# Patient Record
Sex: Female | Born: 1985 | Race: Black or African American | Hispanic: No | Marital: Married | Smoking: Never smoker
Health system: Southern US, Community
[De-identification: ages and names within clinical notes are randomized; demographics above are authoritative.]

## PROBLEM LIST (undated history)

## (undated) DIAGNOSIS — B019 Varicella without complication: Secondary | ICD-10-CM

## (undated) DIAGNOSIS — D219 Benign neoplasm of connective and other soft tissue, unspecified: Secondary | ICD-10-CM

## (undated) DIAGNOSIS — Z1371 Encounter for nonprocreative screening for genetic disease carrier status: Secondary | ICD-10-CM

## (undated) DIAGNOSIS — R87619 Unspecified abnormal cytological findings in specimens from cervix uteri: Secondary | ICD-10-CM

## (undated) DIAGNOSIS — Z9189 Other specified personal risk factors, not elsewhere classified: Secondary | ICD-10-CM

## (undated) DIAGNOSIS — N920 Excessive and frequent menstruation with regular cycle: Secondary | ICD-10-CM

## (undated) DIAGNOSIS — D649 Anemia, unspecified: Secondary | ICD-10-CM

## (undated) DIAGNOSIS — Z803 Family history of malignant neoplasm of breast: Secondary | ICD-10-CM

## (undated) HISTORY — DX: Anemia, unspecified: D64.9

## (undated) HISTORY — DX: Unspecified abnormal cytological findings in specimens from cervix uteri: R87.619

## (undated) HISTORY — DX: Excessive and frequent menstruation with regular cycle: N92.0

## (undated) HISTORY — DX: Varicella without complication: B01.9

## (undated) HISTORY — DX: Family history of malignant neoplasm of breast: Z80.3

## (undated) HISTORY — PX: CERVICAL BIOPSY  W/ LOOP ELECTRODE EXCISION: SUR135

## (undated) HISTORY — DX: Encounter for nonprocreative screening for genetic disease carrier status: Z13.71

## (undated) HISTORY — DX: Other specified personal risk factors, not elsewhere classified: Z91.89

## (undated) HISTORY — DX: Benign neoplasm of connective and other soft tissue, unspecified: D21.9

---

## 2007-06-14 HISTORY — PX: TUBAL LIGATION: SHX77

## 2014-07-15 ENCOUNTER — Ambulatory Visit: Payer: Self-pay | Admitting: Internal Medicine

## 2014-07-16 ENCOUNTER — Encounter (INDEPENDENT_AMBULATORY_CARE_PROVIDER_SITE_OTHER): Payer: Self-pay

## 2014-07-16 ENCOUNTER — Encounter: Payer: Self-pay | Admitting: Internal Medicine

## 2014-07-16 ENCOUNTER — Ambulatory Visit (INDEPENDENT_AMBULATORY_CARE_PROVIDER_SITE_OTHER): Payer: BLUE CROSS/BLUE SHIELD | Admitting: Internal Medicine

## 2014-07-16 VITALS — BP 102/70 | HR 79 | Temp 98.2°F | Ht 64.25 in | Wt 154.0 lb

## 2014-07-16 DIAGNOSIS — N898 Other specified noninflammatory disorders of vagina: Secondary | ICD-10-CM

## 2014-07-16 DIAGNOSIS — Z0000 Encounter for general adult medical examination without abnormal findings: Secondary | ICD-10-CM

## 2014-07-16 LAB — COMPREHENSIVE METABOLIC PANEL
ALBUMIN: 4.2 g/dL (ref 3.5–5.2)
ALK PHOS: 56 U/L (ref 39–117)
ALT: 8 U/L (ref 0–35)
AST: 12 U/L (ref 0–37)
BILIRUBIN TOTAL: 0.3 mg/dL (ref 0.2–1.2)
BUN: 16 mg/dL (ref 6–23)
CHLORIDE: 106 meq/L (ref 96–112)
CO2: 30 meq/L (ref 19–32)
Calcium: 9.2 mg/dL (ref 8.4–10.5)
Creatinine, Ser: 0.85 mg/dL (ref 0.40–1.20)
GFR: 101.28 mL/min (ref >60.00)
Glucose, Bld: 101 mg/dL — ABNORMAL HIGH (ref 70–99)
Potassium: 4.2 mEq/L (ref 3.5–5.1)
Sodium: 138 mEq/L (ref 135–145)
Total Protein: 6.8 g/dL (ref 6.0–8.3)

## 2014-07-16 LAB — CBC
HCT: 38.5 % (ref 36.0–46.0)
HEMOGLOBIN: 12.6 g/dL (ref 12.0–15.0)
MCHC: 32.8 g/dL (ref 30.0–36.0)
MCV: 75.1 fl — AB (ref 78.0–100.0)
PLATELETS: 332 10*3/uL (ref 150.0–400.0)
RBC: 5.12 Mil/uL — AB (ref 3.87–5.11)
RDW: 13.7 % (ref 11.5–15.5)
WBC: 5.1 10*3/uL (ref 4.0–10.5)

## 2014-07-16 LAB — LIPID PANEL
Cholesterol: 149 mg/dL (ref 0–200)
HDL: 57.8 mg/dL (ref >39.00)
LDL Cholesterol: 76 mg/dL (ref 0–99)
NonHDL: 91.2
Total CHOL/HDL Ratio: 3
Triglycerides: 76 mg/dL (ref 0.0–149.0)
VLDL: 15.2 mg/dL (ref 0.0–40.0)

## 2014-07-16 NOTE — Progress Notes (Signed)
Pre visit review using our clinic review tool, if applicable. No additional management support is needed unless otherwise documented below in the visit note. 

## 2014-07-16 NOTE — Patient Instructions (Signed)

## 2014-07-16 NOTE — Progress Notes (Signed)
HPI  Pt presents to the clinic today to establish care. She has not had a PCP in many years. She recently moved from [place], [place].   Flu: never Tetanus: unsure, she thinks it is less than 10 years ago LMP: 07/06/14, G3P3 Pap Smear: 2014- normal Dentist: anually  Of note, her mom had breast cancer diagnosed when she was 21. She did have a mammogram in 2014. She is due for a repeat mammogram this year.  She does have some issues with vaginal discharge. She did not want to answer any additional questions about her discharge. She request referral to gyn.  Past Medical History  Diagnosis Date  . Chicken pox     No current outpatient prescriptions on file.   No current facility-administered medications for this visit.    No Known Allergies  Family History  Problem Relation Age of Onset  . Alcohol abuse Mother   . Cancer Mother     breast  . Alcohol abuse Father   . Alcohol abuse Maternal Grandmother   . Cancer Maternal Grandmother     lung    History   Social History  . Marital Status: Married    Spouse Name: N/A    Number of Children: N/A  . Years of Education: N/A   Occupational History  . Not on file.   Social History Main Topics  . Smoking status: Never Smoker   . Smokeless tobacco: Never Used  . Alcohol Use: 0.0 oz/week    0 Not specified per week     Comment: occasional  . Drug Use: No  . Sexual Activity: Not on file   Other Topics Concern  . Not on file   Social History Narrative  . No narrative on file    ROS:  Constitutional: Denies fever, malaise, fatigue, headache or abrupt weight changes.  HEENT: Denies eye pain, eye redness, ear pain, ringing in the ears, wax buildup, runny nose, nasal congestion, bloody nose, or sore throat. Respiratory: Denies difficulty breathing, shortness of breath, cough or sputum production.   Cardiovascular: Denies chest pain, chest tightness, palpitations or swelling in the hands or feet.  Gastrointestinal:  Denies abdominal pain, bloating, constipation, diarrhea or blood in the stool.  GU: Pt reports vaginal discharge. Denies frequency, urgency, pain with urination, blood in urine, odor. Musculoskeletal: Denies decrease in range of motion, difficulty with gait, muscle pain or joint pain and swelling.  Skin: Denies redness, rashes, lesions or ulcercations.  Neurological: Denies dizziness, difficulty with memory, difficulty with speech or problems with balance and coordination.  Psych: Pt denies anxiety, depression, SI/HI.  No other specific complaints in a complete review of systems (except as listed in HPI above).  PE:  BP 102/70 mmHg  Pulse 79  Temp(Src) 98.2 F (36.8 C) (Oral)  Ht 5' 4.25" (1.632 m)  Wt 154 lb (69.854 kg)  BMI 26.23 kg/m2  SpO2 99%  LMP 07/06/2014 Wt Readings from Last 3 Encounters:  07/16/14 154 lb (69.854 kg)    General: Appears her stated age, well developed, well nourished in NAD. Skin: Warm, dry and intact. No rashes, lesion or ulceration noted. HEENT: Head: normal shape and size; Eyes: sclera white, no icterus, conjunctiva pink, PERRLA and EOMs intact; Ears: Tm's gray and intact, normal light reflex; Nose: mucosa pink and moist, septum midline; Throat/Mouth: Teeth present, mucosa pink and moist, no lesions or ulcerations noted.  Neck:  Neck supple, trachea midline. No masses, lumps or thyromegaly present.  Cardiovascular: Normal rate and  rhythm. S1,S2 noted.  No murmur, rubs or gallops noted. No JVD or BLE edema.  Pulmonary/Chest: Normal effort and positive vesicular breath sounds. No respiratory distress. No wheezes, rales or ronchi noted.  Abdomen: Soft and nontender. Normal bowel sounds, no bruits noted. No distention or masses noted. Liver, spleen and kidneys non palpable. Pelvic: She declines exam today. Musculoskeletal: Normal range of motion. Strength 5/5 BUE/BLE. No difficulty with gait.  Neurological: Alert and oriented. Cranial nerves II-XII grossly  intact. Coordination normal.  Psychiatric: Mood and affect normal. Behavior is normal. Judgment and thought content normal.    Assessment and Plan:  Preventative Health Maintenance:  She declines flu and Tdap today Encouraged her to work on diet and exercise Will refer to [facility] for vaginal discharge and to schedule her mammogram Will check CBC, CMET and Lipid profile today  RTC in 1 year or sooner if needed

## 2016-05-03 LAB — HM PAP SMEAR: HM Pap smear: NORMAL

## 2016-10-03 ENCOUNTER — Encounter: Payer: Self-pay | Admitting: Internal Medicine

## 2016-10-03 ENCOUNTER — Ambulatory Visit (INDEPENDENT_AMBULATORY_CARE_PROVIDER_SITE_OTHER): Payer: BLUE CROSS/BLUE SHIELD | Admitting: Internal Medicine

## 2016-10-03 VITALS — BP 112/70 | HR 70 | Temp 98.0°F | Wt 176.0 lb

## 2016-10-03 DIAGNOSIS — B029 Zoster without complications: Secondary | ICD-10-CM | POA: Diagnosis not present

## 2016-10-03 MED ORDER — VALACYCLOVIR HCL 1 G PO TABS: 1000.0000 mg | ORAL_TABLET | Freq: Three times a day (TID) | ORAL | 0 refills | Status: DC

## 2016-10-03 NOTE — Patient Instructions (Signed)
Shingles Shingles is an infection that causes a painful skin rash and fluid-filled blisters. Shingles is caused by the same virus that causes chickenpox. Shingles only develops in people who:  Have had chickenpox.  Have gotten the chickenpox vaccine. (This is rare.) The first symptoms of shingles may be itching, tingling, or pain in an area on your skin. A rash will follow in a few days or weeks. The rash is usually on one side of the body in a bandlike or beltlike pattern. Over time, the rash turns into fluid-filled blisters that break open, scab over, and dry up. Medicines may:  Help you manage pain.  Help you recover more quickly.  Help to prevent long-term problems. Follow these instructions at home: Medicines  Take medicines only as told by your doctor.  Apply an anti-itch or numbing cream to the affected area as told by your doctor. Blister and Rash Care  Take a cool bath or put cool compresses on the area of the rash or blisters as told by your doctor. This may help with pain and itching.  Keep your rash covered with a loose bandage (dressing). Wear loose-fitting clothing.  Keep your rash and blisters clean with mild soap and cool water or as told by your doctor.  Check your rash every day for signs of infection. These include redness, swelling, and pain that lasts or gets worse.  Do not pick your blisters.  Do not scratch your rash. General instructions  Rest as told by your doctor.  Keep all follow-up visits as told by your doctor. This is important.  Until your blisters scab over, your infection can cause chickenpox in people who have never had it or been vaccinated against it. To prevent this from happening, avoid touching other people or being around other people, especially:  Babies.  Pregnant women.  Children who have eczema.  Elderly people who have transplants.  People who have chronic illnesses, such as leukemia or AIDS. Contact a doctor if:  Your  pain does not get better with medicine.  Your pain does not get better after the rash heals.  Your rash looks infected. Signs of infection include:  Redness.  Swelling.  Pain that lasts or gets worse. Get help right away if:  The rash is on your face or nose.  You have pain in your face, pain around your eye area, or loss of feeling on one side of your face.  You have ear pain or you have ringing in your ear.  You have loss of taste.  Your condition gets worse. This information is not intended to replace advice given to you by your health care provider. Make sure you discuss any questions you have with your health care provider. Document Released: 11/16/2007 Document Revised: 01/24/2016 Document Reviewed: 03/11/2014 Elsevier Interactive Patient Education  2017 Elsevier Inc.  

## 2016-10-03 NOTE — Progress Notes (Signed)
Subjective:    Patient ID: Gina Benson, female    DOB: Nov 14, 1985, 31 y.o.   MRN: [id number]  HPI  Pt presents to the clinic today with c/o a rash on the right side of her abdomen. She noticed this 3 days ago. The rash wraps around to her mid back. She reports the rash is painful. She describes the pain as sharp and shooting. The rash has not spread to any other part of her body. She denies changes in soaps, lotions or detergents. She has tried Calamine lotion without any relief.   Review of Systems      Past Medical History:  Diagnosis Date  . Chicken pox     No current outpatient prescriptions on file.   No current facility-administered medications for this visit.     No Known Allergies  Family History  Problem Relation Age of Onset  . Alcohol abuse Mother   . Cancer Mother     breast  . Alcohol abuse Father   . Alcohol abuse Maternal Grandmother   . Cancer Maternal Grandmother     lung    Social History   Social History  . Marital status: Married    Spouse name: N/A  . Number of children: N/A  . Years of education: N/A   Occupational History  . Not on file.   Social History Main Topics  . Smoking status: Never Smoker  . Smokeless tobacco: Never Used  . Alcohol use 0.0 oz/week     Comment: occasional  . Drug use: No  . Sexual activity: Yes   Other Topics Concern  . Not on file   Social History Narrative  . No narrative on file     Constitutional: Denies fever, malaise, fatigue, headache or abrupt weight changes.  Skin: Pt reports rash. Denies ulcercations.    No other specific complaints in a complete review of systems (except as listed in HPI above).  Objective:   Physical Exam   BP 112/70   Pulse 70   Temp 98 F (36.7 C) (Oral)   Wt 176 lb (79.8 kg)   LMP 09/14/2016   SpO2 98%   BMI 29.98 kg/m  Wt Readings from Last 3 Encounters:  10/03/16 176 lb (79.8 kg)  07/16/14 154 lb (69.9 kg)    General: Appears her  stated age, in  NAD. Skin: Scattered vesicular lesions noted in a dermatomal pattern. Most lesion on right anterior abdomen. She is sensitive to touch from midline back around to right abdomen.  BMET    Component Value Date/Time   NA 138 07/16/2014 1512   K 4.2 07/16/2014 1512   CL 106 07/16/2014 1512   CO2 30 07/16/2014 1512   GLUCOSE 101 (H) 07/16/2014 1512   BUN 16 07/16/2014 1512   CREATININE 0.85 07/16/2014 1512   CALCIUM 9.2 07/16/2014 1512    Lipid Panel     Component Value Date/Time   CHOL 149 07/16/2014 1512   TRIG 76.0 07/16/2014 1512   HDL 57.80 07/16/2014 1512   CHOLHDL 3 07/16/2014 1512   VLDL 15.2 07/16/2014 1512   LDLCALC 76 07/16/2014 1512    CBC    Component Value Date/Time   WBC 5.1 07/16/2014 1512   RBC 5.12 (H) 07/16/2014 1512   HGB 12.6 07/16/2014 1512   HCT 38.5 07/16/2014 1512   PLT 332.0 07/16/2014 1512   MCV 75.1 (L) 07/16/2014 1512   MCHC 32.8 07/16/2014 1512   RDW 13.7 07/16/2014 1512  Hgb A1C No results found for: HGBA1C       Assessment & Plan:   Shingles:  eRx for Valtrex 1 gm TID x 7 days  RTC as needed or if symptoms persist or worsen Rashada Klontz, NP

## 2016-10-04 ENCOUNTER — Telehealth: Payer: Self-pay

## 2016-10-04 MED ORDER — GABAPENTIN 100 MG PO CAPS: 100.0000 mg | ORAL_CAPSULE | Freq: Three times a day (TID) | ORAL | 0 refills | Status: DC | PRN

## 2016-10-04 NOTE — Telephone Encounter (Signed)
Pt left /vm; pt seen 10/03/16 with shingles; pt presently taking ibuprofen 800mg  for pain without relief. Pt request med for pain; I tried x 2 to contact pt for more info about where pain is and degree of pain but unable to reach pt.Please advise. [facility].

## 2016-10-04 NOTE — Telephone Encounter (Signed)
Neurontin sent to pharmacy

## 2016-10-04 NOTE — Addendum Note (Signed)
Addended by: Jearld Fenton on: 10/04/2016 10:12 AM   Modules accepted: Orders

## 2016-10-11 ENCOUNTER — Telehealth: Payer: Self-pay | Admitting: Internal Medicine

## 2016-10-11 NOTE — Telephone Encounter (Signed)
Left message asking pt to call office I have questions about fmla paperwork

## 2016-10-13 NOTE — Telephone Encounter (Signed)
Spoke with pt she stated she needed intermittent leave FMLA paperwork in [place]

## 2016-10-14 NOTE — Telephone Encounter (Signed)
I spoke to pt and she reports she is doing much better, still having discomfort and intermittent shooting pain in the area also tingling and itching... Rash has gone away for most part... Pt states she needs the paperwork filled for intermittent and may reoccur as far as the Sx... Please advise

## 2016-10-14 NOTE — Telephone Encounter (Signed)
Before I fill this out, can you call and see how her shingles is doing. How is her pain currently. How many days did she miss from work?

## 2016-10-17 DIAGNOSIS — Z7689 Persons encountering health services in other specified circumstances: Secondary | ICD-10-CM

## 2016-10-17 NOTE — Telephone Encounter (Signed)
Copy for file Copy for scan Copy for pt Copy for billing

## 2016-10-17 NOTE — Telephone Encounter (Signed)
Left message asking pt to call office  °

## 2016-10-17 NOTE — Telephone Encounter (Signed)
Done, given to Robin 

## 2016-10-17 NOTE — Telephone Encounter (Signed)
Paperwork faxed °

## 2017-07-14 DIAGNOSIS — Z9189 Other specified personal risk factors, not elsewhere classified: Secondary | ICD-10-CM

## 2017-07-14 DIAGNOSIS — Z1371 Encounter for nonprocreative screening for genetic disease carrier status: Secondary | ICD-10-CM

## 2017-07-14 HISTORY — DX: Encounter for nonprocreative screening for genetic disease carrier status: Z13.71

## 2017-07-14 HISTORY — DX: Other specified personal risk factors, not elsewhere classified: Z91.89

## 2017-07-25 ENCOUNTER — Ambulatory Visit (INDEPENDENT_AMBULATORY_CARE_PROVIDER_SITE_OTHER): Payer: BLUE CROSS/BLUE SHIELD | Admitting: Advanced Practice Midwife

## 2017-07-25 ENCOUNTER — Encounter: Payer: Self-pay | Admitting: Advanced Practice Midwife

## 2017-07-25 VITALS — BP 122/70 | Ht 64.0 in | Wt 180.0 lb

## 2017-07-25 DIAGNOSIS — Z01419 Encounter for gynecological examination (general) (routine) without abnormal findings: Secondary | ICD-10-CM | POA: Diagnosis not present

## 2017-07-25 NOTE — Progress Notes (Signed)
Patient ID: Gina Benson, female   DOB: 04-Aug-1985, 32 y.o.   MRN: [id number]    Gynecology Annual Exam  PCP: Jearld Fenton, NP  Chief Complaint:  Chief Complaint  Patient presents with  . Annual Exam    History of Present Illness: Patient is a 32 y.o. [id number] presents for annual exam. The patient has complaint today of chronic BV and yeast infections. She has noticed an odor recently and wonders if she has BV. She denies itching, irritation or discharge. She states her periods are long, heavy and painful but declines hormonal regulation. She has spotting for 3 days followed by 7 days of heavy bleeding and another 3 days of spotting.   LMP: Patient's last menstrual period was 07/04/2017. Average Interval: regular, 28 days Duration of flow: 13 days Heavy Menses: yes for 7 of the 13 days Clots: no Intermenstrual Bleeding: no Postcoital Bleeding: no Dysmenorrhea: yes  The patient is sexually active. She currently uses tubal ligation for contraception. She denies dyspareunia.  The patient does perform self breast exams.  There is notable family history of breast or ovarian cancer in her family. Her mother was diagnosed with breast cancer at age 20 and again recently now at age 28. The patient accepts [employer] screening today.  The patient wears seatbelts: yes.   The patient has regular exercise: yes.  She has been eating a healthy diet and has adequate intake of h2o.   The patient denies current symptoms of depression.    Review of Systems: Review of Systems  Constitutional: Negative.   HENT: Negative.   Eyes: Negative.   Respiratory: Negative.   Cardiovascular: Negative.   Gastrointestinal: Negative.   Genitourinary: Negative.   Musculoskeletal: Negative.   Skin: Negative.   Neurological: Negative.   Endo/Heme/Allergies: Negative.   Psychiatric/Behavioral: Negative.     Past Medical History:  Past Medical History:  Diagnosis Date  . Abnormal Pap smear of cervix   .  Anemia   . Chicken pox   . Fibroids   . Menorrhagia     Past Surgical History:  Past Surgical History:  Procedure Laterality Date  . CERVICAL BIOPSY  W/ LOOP ELECTRODE EXCISION    . TUBAL LIGATION  2009    Gynecologic History:  Patient's last menstrual period was 07/04/2017. Contraception: tubal ligation Last Pap: 2017 Results were:  no abnormalities  Last mammogram: she has had a mammogram in the past for benign cysts  Obstetric History: [id number]  Family History:  Family History  Problem Relation Age of Onset  . Alcohol abuse Mother   . Cancer Mother        breast  . Alcohol abuse Father   . Alcohol abuse Maternal Grandmother   . Cancer Maternal Grandmother        lung    Social History:  Social History   Socioeconomic History  . Marital status: Married    Spouse name: Not on file  . Number of children: Not on file  . Years of education: Not on file  . Highest education level: Not on file  Social Needs  . Financial resource strain: Not on file  . Food insecurity - worry: Not on file  . Food insecurity - inability: Not on file  . Transportation needs - medical: Not on file  . Transportation needs - non-medical: Not on file  Occupational History  . Not on file  Tobacco Use  . Smoking status: Never Smoker  . Smokeless tobacco: Never Used  Substance  and Sexual Activity  . Alcohol use: Yes    Alcohol/week: 0.0 oz    Comment: occasional  . Drug use: No  . Sexual activity: Yes    Birth control/protection: Surgical  Other Topics Concern  . Not on file  Social History Narrative  . Not on file    Allergies:  No Known Allergies  Medications: Prior to Admission medications   Medication Sig Start Date End Date Taking? Authorizing Provider  gabapentin (NEURONTIN) 100 MG capsule Take 1 capsule (100 mg total) by mouth 3 (three) times daily as needed. Patient not taking: Reported on 07/25/2017 10/04/16   Jearld Fenton, NP  valACYclovir (VALTREX) 1000 MG tablet  Take 1 tablet (1,000 mg total) by mouth 3 (three) times daily. Patient not taking: Reported on 07/25/2017 10/03/16   Jearld Fenton, NP    Physical Exam Vitals: Blood pressure 122/70, height 5\' 4"  (1.626 m), weight 180 lb (81.6 kg), last menstrual period 07/04/2017.  General: NAD HEENT: normocephalic, anicteric Thyroid: no enlargement, no palpable nodules Pulmonary: No increased work of breathing, CTAB Cardiovascular: RRR, distal pulses 2+ Breast: Breast symmetrical, no tenderness, no palpable nodules or masses, no skin or nipple retraction present, no nipple discharge.  No axillary or supraclavicular lymphadenopathy. Abdomen: NABS, soft, non-tender, non-distended.  Umbilicus without lesions.  No hepatomegaly, splenomegaly or masses palpable. No evidence of hernia  Genitourinary:  External: Normal external female genitalia.  Normal urethral meatus, normal  Bartholin's and Skene's glands.    Vagina: Normal vaginal mucosa, no evidence of prolapse.    Cervix: Grossly normal in appearance, no bleeding, no CMT  Uterus: Non-enlarged, mobile, normal contour.    Adnexa: ovaries non-enlarged, no adnexal masses  Rectal: deferred  Lymphatic: no evidence of inguinal lymphadenopathy Extremities: no edema, erythema, or tenderness Neurologic: Grossly intact Psychiatric: mood appropriate, affect full  Wet prep negative for clue cells, whiff, yeast   Assessment: 32 y.o. G3P3003 routine annual exam  Plan: Problem List Items Addressed This Visit    None    Visit Diagnoses    Well woman exam with routine gynecological exam    -  Primary      1) Mammogram - recommend yearly screening mammogram.  Mammogram begin regular screening at age 32 or sooner pending MyRisk results   2) STI screening was offered and declined  3) ASCCP guidelines and rational discussed.  Patient opts for every 3 years screening interval  4) Contraception - the patient is currently using  tubal ligation.    5)  Colonoscopy -- Screening recommended starting at age 33 for average risk individuals, age 13 for individuals deemed at increased risk (including African Americans) and recommended to continue until age 33.  For patient age 33-85 individualized approach is recommended.  Gold standard screening is via colonoscopy, Cologuard screening is an acceptable alternative for patient unwilling or unable to undergo colonoscopy.  "Colorectal cancer screening for average?risk adults: 2018 guideline update from the American Cancer Society"CA: A Cancer Journal for Clinicians: Nov 09, 2016   6) Routine healthcare maintenance including cholesterol, diabetes screening discussed Declines  7) Return in 1 year (on 07/25/2018) for annual established gyn.  Rod Can, [facility], [facility] 07/25/2017, 4:40 PM

## 2017-07-25 NOTE — Patient Instructions (Signed)
Health Maintenance, Female Adopting a healthy lifestyle and getting preventive care can go a long way to promote health and wellness. Talk with your health care provider about what schedule of regular examinations is right for you. This is a good chance for you to check in with your provider about disease prevention and staying healthy. In between checkups, there are plenty of things you can do on your own. Experts have done a lot of research about which lifestyle changes and preventive measures are most likely to keep you healthy. Ask your health care provider for more information. Weight and diet Eat a healthy diet  Be sure to include plenty of vegetables, fruits, low-fat dairy products, and lean protein.  Do not eat a lot of foods high in solid fats, added sugars, or salt.  Get regular exercise. This is one of the most important things you can do for your health. ? Most adults should exercise for at least 150 minutes each week. The exercise should increase your heart rate and make you sweat (moderate-intensity exercise). ? Most adults should also do strengthening exercises at least twice a week. This is in addition to the moderate-intensity exercise.  Maintain a healthy weight  Body mass index (BMI) is a measurement that can be used to identify possible weight problems. It estimates body fat based on height and weight. Your health care provider can help determine your BMI and help you achieve or maintain a healthy weight.  For females 69 years of age and older: ? A BMI below 18.5 is considered underweight. ? A BMI of 18.5 to 24.9 is normal. ? A BMI of 25 to 29.9 is considered overweight. ? A BMI of 30 and above is considered obese.  Watch levels of cholesterol and blood lipids  You should start having your blood tested for lipids and cholesterol at 33 years of age, then have this test every 5 years.  You may need to have your cholesterol levels checked more often if: ? Your lipid or  cholesterol levels are high. ? You are older than 33 years of age. ? You are at high risk for heart disease.  Cancer screening Lung Cancer  Lung cancer screening is recommended for adults 70-27 years old who are at high risk for lung cancer because of a history of smoking.  A yearly low-dose CT scan of the lungs is recommended for people who: ? Currently smoke. ? Have quit within the past 15 years. ? Have at least a 30-pack-year history of smoking. A pack year is smoking an average of one pack of cigarettes a day for 1 year.  Yearly screening should continue until it has been 15 years since you quit.  Yearly screening should stop if you develop a health problem that would prevent you from having lung cancer treatment.  Breast Cancer  Practice breast self-awareness. This means understanding how your breasts normally appear and feel.  It also means doing regular breast self-exams. Let your health care provider know about any changes, no matter how small.  If you are in your 20s or 30s, you should have a clinical breast exam (CBE) by a health care provider every 1-3 years as part of a regular health exam.  If you are 68 or older, have a CBE every year. Also consider having a breast X-ray (mammogram) every year.  If you have a family history of breast cancer, talk to your health care provider about genetic screening.  If you are at high risk  for breast cancer, talk to your health care provider about having an MRI and a mammogram every year.  Breast cancer gene (BRCA) assessment is recommended for women who have family members with BRCA-related cancers. BRCA-related cancers include: ? Breast. ? Ovarian. ? Tubal. ? Peritoneal cancers.  Results of the assessment will determine the need for genetic counseling and BRCA1 and BRCA2 testing.  Cervical Cancer Your health care provider may recommend that you be screened regularly for cancer of the pelvic organs (ovaries, uterus, and  vagina). This screening involves a pelvic examination, including checking for microscopic changes to the surface of your cervix (Pap test). You may be encouraged to have this screening done every 3 years, beginning at age 22.  For women ages 56-65, health care providers may recommend pelvic exams and Pap testing every 3 years, or they may recommend the Pap and pelvic exam, combined with testing for human papilloma virus (HPV), every 5 years. Some types of HPV increase your risk of cervical cancer. Testing for HPV may also be done on women of any age with unclear Pap test results.  Other health care providers may not recommend any screening for nonpregnant women who are considered low risk for pelvic cancer and who do not have symptoms. Ask your health care provider if a screening pelvic exam is right for you.  If you have had past treatment for cervical cancer or a condition that could lead to cancer, you need Pap tests and screening for cancer for at least 20 years after your treatment. If Pap tests have been discontinued, your risk factors (such as having a new sexual partner) need to be reassessed to determine if screening should resume. Some women have medical problems that increase the chance of getting cervical cancer. In these cases, your health care provider may recommend more frequent screening and Pap tests.  Colorectal Cancer  This type of cancer can be detected and often prevented.  Routine colorectal cancer screening usually begins at 33 years of age and continues through 33 years of age.  Your health care provider may recommend screening at an earlier age if you have risk factors for colon cancer.  Your health care provider may also recommend using home test kits to check for hidden blood in the stool.  A small camera at the end of a tube can be used to examine your colon directly (sigmoidoscopy or colonoscopy). This is done to check for the earliest forms of colorectal  cancer.  Routine screening usually begins at age 33.  Direct examination of the colon should be repeated every 5-10 years through 33 years of age. However, you may need to be screened more often if early forms of precancerous polyps or small growths are found.  Skin Cancer  Check your skin from head to toe regularly.  Tell your health care provider about any new moles or changes in moles, especially if there is a change in a mole's shape or color.  Also tell your health care provider if you have a mole that is larger than the size of a pencil eraser.  Always use sunscreen. Apply sunscreen liberally and repeatedly throughout the day.  Protect yourself by wearing long sleeves, pants, a wide-brimmed hat, and sunglasses whenever you are outside.  Heart disease, diabetes, and high blood pressure  High blood pressure causes heart disease and increases the risk of stroke. High blood pressure is more likely to develop in: ? People who have blood pressure in the high end of  the normal range (130-139/85-89 mm Hg). ? People who are overweight or obese. ? People who are African American.  If you are 21-29 years of age, have your blood pressure checked every 3-5 years. If you are 3 years of age or older, have your blood pressure checked every year. You should have your blood pressure measured twice-once when you are at a hospital or clinic, and once when you are not at a hospital or clinic. Record the average of the two measurements. To check your blood pressure when you are not at a hospital or clinic, you can use: ? An automated blood pressure machine at a pharmacy. ? A home blood pressure monitor.  If you are between 17 years and 37 years old, ask your health care provider if you should take aspirin to prevent strokes.  Have regular diabetes screenings. This involves taking a blood sample to check your fasting blood sugar level. ? If you are at a normal weight and have a low risk for diabetes,  have this test once every three years after 33 years of age. ? If you are overweight and have a high risk for diabetes, consider being tested at a younger age or more often. Preventing infection Hepatitis B  If you have a higher risk for hepatitis B, you should be screened for this virus. You are considered at high risk for hepatitis B if: ? You were born in a country where hepatitis B is common. Ask your health care provider which countries are considered high risk. ? Your parents were born in a high-risk country, and you have not been immunized against hepatitis B (hepatitis B vaccine). ? You have HIV or AIDS. ? You use needles to inject street drugs. ? You live with someone who has hepatitis B. ? You have had sex with someone who has hepatitis B. ? You get hemodialysis treatment. ? You take certain medicines for conditions, including cancer, organ transplantation, and autoimmune conditions.  Hepatitis C  Blood testing is recommended for: ? Everyone born from [birth year] through 1966. ? Anyone with known risk factors for hepatitis C.  Sexually transmitted infections (STIs)  You should be screened for sexually transmitted infections (STIs) including gonorrhea and chlamydia if: ? You are sexually active and are younger than 33 years of age. ? You are older than 33 years of age and your health care provider tells you that you are at risk for this type of infection. ? Your sexual activity has changed since you were last screened and you are at an increased risk for chlamydia or gonorrhea. Ask your health care provider if you are at risk.  If you do not have HIV, but are at risk, it may be recommended that you take a prescription medicine daily to prevent HIV infection. This is called pre-exposure prophylaxis (PrEP). You are considered at risk if: ? You are sexually active and do not regularly use condoms or know the HIV status of your partner(s). ? You take drugs by injection. ? You are  sexually active with a partner who has HIV.  Talk with your health care provider about whether you are at high risk of being infected with HIV. If you choose to begin PrEP, you should first be tested for HIV. You should then be tested every 3 months for as long as you are taking PrEP. Pregnancy  If you are premenopausal and you may become pregnant, ask your health care provider about preconception counseling.  If you may become  pregnant, take 400 to 800 micrograms (mcg) of folic acid every day.  If you want to prevent pregnancy, talk to your health care provider about birth control (contraception). Osteoporosis and menopause  Osteoporosis is a disease in which the bones lose minerals and strength with aging. This can result in serious bone fractures. Your risk for osteoporosis can be identified using a bone density scan.  If you are 52 years of age or older, or if you are at risk for osteoporosis and fractures, ask your health care provider if you should be screened.  Ask your health care provider whether you should take a calcium or vitamin D supplement to lower your risk for osteoporosis.  Menopause may have certain physical symptoms and risks.  Hormone replacement therapy may reduce some of these symptoms and risks. Talk to your health care provider about whether hormone replacement therapy is right for you. Follow these instructions at home:  Schedule regular health, dental, and eye exams.  Stay current with your immunizations.  Do not use any tobacco products including cigarettes, chewing tobacco, or electronic cigarettes.  If you are pregnant, do not drink alcohol.  If you are breastfeeding, limit how much and how often you drink alcohol.  Limit alcohol intake to no more than 1 drink per day for nonpregnant women. One drink equals 12 ounces of beer, 5 ounces of wine, or 1 ounces of hard liquor.  Do not use street drugs.  Do not share needles.  Ask your health care  provider for help if you need support or information about quitting drugs.  Tell your health care provider if you often feel depressed.  Tell your health care provider if you have ever been abused or do not feel safe at home. This information is not intended to replace advice given to you by your health care provider. Make sure you discuss any questions you have with your health care provider. Document Released: 12/13/2010 Document Revised: 11/05/2015 Document Reviewed: 03/03/2015 Elsevier Interactive Patient Education  2018 [employer].     Why follow it? Research shows. . Those who follow the Mediterranean diet have a reduced risk of heart disease  . The diet is associated with a reduced incidence of Parkinson's and Alzheimer's diseases . People following the diet may have longer life expectancies and lower rates of chronic diseases  . The Dietary Guidelines for Americans recommends the Mediterranean diet as an eating plan to promote health and prevent disease  What Is the Mediterranean Diet?  . Healthy eating plan based on typical foods and recipes of Mediterranean-style cooking . The diet is primarily a plant based diet; these foods should make up a majority of meals   Starches - Plant based foods should make up a majority of meals - They are an important sources of vitamins, minerals, energy, antioxidants, and fiber - Choose whole grains, foods high in fiber and minimally processed items  - Typical grain sources include wheat, oats, barley, corn, brown rice, bulgar, farro, millet, polenta, couscous  - Various types of beans include chickpeas, lentils, fava beans, black beans, white beans   Fruits  Veggies - Large quantities of antioxidant rich fruits & veggies; 6 or more servings  - Vegetables can be eaten raw or lightly drizzled with oil and cooked  - Vegetables common to the traditional Mediterranean Diet include: artichokes, arugula, beets, broccoli, brussel sprouts, cabbage,  carrots, celery, collard greens, cucumbers, eggplant, kale, leeks, lemons, lettuce, mushrooms, okra, onions, peas, peppers, potatoes, pumpkin, radishes, rutabaga,  shallots, spinach, sweet potatoes, turnips, zucchini - Fruits common to the Mediterranean Diet include: apples, apricots, avocados, cherries, clementines, dates, figs, grapefruits, grapes, melons, nectarines, oranges, peaches, pears, pomegranates, strawberries, tangerines  Fats - Replace butter and margarine with healthy oils, such as olive oil, canola oil, and tahini  - Limit nuts to no more than a handful a day  - Nuts include walnuts, almonds, pecans, pistachios, pine nuts  - Limit or avoid candied, honey roasted or heavily salted nuts - Olives are central to the [employer] - can be eaten whole or used in a variety of dishes   Meats Protein - Limiting red meat: no more than a few times a month - When eating red meat: choose lean cuts and keep the portion to the size of deck of cards - Eggs: approx. 0 to 4 times a week  - Fish and lean poultry: at least 2 a week  - Healthy protein sources include, chicken, [place], lean beef, lamb - Increase intake of seafood such as tuna, salmon, trout, mackerel, shrimp, scallops - Avoid or limit high fat processed meats such as sausage and bacon  Dairy - Include moderate amounts of low fat dairy products  - Focus on healthy dairy such as fat free yogurt, skim milk, low or reduced fat cheese - Limit dairy products higher in fat such as whole or 2% milk, cheese, ice cream  Alcohol - Moderate amounts of red wine is ok  - No more than 5 oz daily for women (all ages) and men older than age 18  - No more than 10 oz of wine daily for men younger than 98  Other - Limit sweets and other desserts  - Use herbs and spices instead of salt to flavor foods  - Herbs and spices common to the traditional Mediterranean Diet include: basil, bay leaves, chives, cloves, cumin, fennel, garlic, lavender, marjoram,  mint, oregano, parsley, pepper, rosemary, sage, savory, sumac, tarragon, thyme   It's not just a diet, it's a lifestyle:  . The Mediterranean diet includes lifestyle factors typical of those in the region  . Foods, drinks and meals are best eaten with others and savored . Daily physical activity is important for overall good health . This could be strenuous exercise like running and aerobics . This could also be more leisurely activities such as walking, housework, yard-work, or taking the stairs . Moderation is the key; a balanced and healthy diet accommodates most foods and drinks . Consider portion sizes and frequency of consumption of certain foods   Meal Ideas & Options:  . Breakfast:  o Whole wheat toast or whole wheat English muffins with peanut butter & hard boiled egg o Steel cut oats topped with apples & cinnamon and skim milk  o Fresh fruit: banana, strawberries, melon, berries, peaches  o Smoothies: strawberries, bananas, greek yogurt, peanut butter o Low fat greek yogurt with blueberries and granola  o Egg white omelet with spinach and mushrooms o Breakfast couscous: whole wheat couscous, apricots, skim milk, cranberries  . Sandwiches:  o Hummus and grilled vegetables (peppers, zucchini, squash) on whole wheat bread   o Grilled chicken on whole wheat pita with lettuce, tomatoes, cucumbers or tzatziki  o Tuna salad on whole wheat bread: tuna salad made with greek yogurt, olives, red peppers, capers, green onions o Garlic rosemary lamb pita: lamb sauted with garlic, rosemary, salt & pepper; add lettuce, cucumber, greek yogurt to pita - flavor with lemon juice and black pepper  .  Seafood:  o Mediterranean grilled salmon, seasoned with garlic, basil, parsley, lemon juice and black pepper o Shrimp, lemon, and spinach whole-grain pasta salad made with low fat greek yogurt  o Seared scallops with lemon orzo  o Seared tuna steaks seasoned salt, pepper, coriander topped with tomato  mixture of olives, tomatoes, olive oil, minced garlic, parsley, green onions and cappers  . Meats:  o Herbed greek chicken salad with kalamata olives, cucumber, feta  o Red bell peppers stuffed with spinach, bulgur, lean ground beef (or lentils) & topped with feta   o Kebabs: skewers of chicken, tomatoes, onions, zucchini, squash  o Turkey burgers: made with red onions, mint, dill, lemon juice, feta cheese topped with roasted red peppers . Vegetarian o Cucumber salad: cucumbers, artichoke hearts, celery, red onion, feta cheese, tossed in olive oil & lemon juice  o Hummus and whole grain pita points with a greek salad (lettuce, tomato, feta, olives, cucumbers, red onion) o Lentil soup with celery, carrots made with vegetable broth, garlic, salt and pepper  o Tabouli salad: parsley, bulgur, mint, scallions, cucumbers, tomato, radishes, lemon juice, olive oil, salt and pepper.      American Heart Association (AHA) Exercise Recommendation  Being physically active is important to prevent heart disease and stroke, the nation's No. 1and No. 5killers. To improve overall cardiovascular health, we suggest at least 150 minutes per week of moderate exercise or 75 minutes per week of vigorous exercise (or a combination of moderate and vigorous activity). Thirty minutes a day, five times a week is an easy goal to remember. You will also experience benefits even if you divide your time into two or three segments of 10 to 15 minutes per day.  For people who would benefit from lowering their blood pressure or cholesterol, we recommend 40 minutes of aerobic exercise of moderate to vigorous intensity three to four times a week to lower the risk for heart attack and stroke.  Physical activity is anything that makes you move your body and burn calories.  This includes things like climbing stairs or playing sports. Aerobic exercises benefit your heart, and include walking, jogging, swimming or biking. Strength and  stretching exercises are best for overall stamina and flexibility.  The simplest, positive change you can make to effectively improve your heart health is to start walking. It's enjoyable, free, easy, social and great exercise. A walking program is flexible and boasts high success rates because people can stick with it. It's easy for walking to become a regular and satisfying part of life.   For Overall Cardiovascular Health:  At least 30 minutes of moderate-intensity aerobic activity at least 5 days per week for a total of 150  OR   At least 25 minutes of vigorous aerobic activity at least 3 days per week for a total of 75 minutes; or a combination of moderate- and vigorous-intensity aerobic activity  AND   Moderate- to high-intensity muscle-strengthening activity at least 2 days per week for additional health benefits.  For Lowering Blood Pressure and Cholesterol  An average 40 minutes of moderate- to vigorous-intensity aerobic activity 3 or 4 times per week  What if I can't make it to the time goal? Something is always better than nothing! And everyone has to start somewhere. Even if you've been sedentary for years, today is the day you can begin to make healthy changes in your life. If you don't think you'll make it for 30 or 40 minutes, set a reachable goal for   today. You can work up toward your overall goal by increasing your time as you get stronger. Don't let all-or-nothing thinking rob you of doing what you can every day.  Source:http://www.heart.org    

## 2017-08-04 ENCOUNTER — Encounter: Payer: Self-pay | Admitting: Obstetrics and Gynecology

## 2017-08-10 ENCOUNTER — Telehealth: Payer: Self-pay | Admitting: Advanced Practice Midwife

## 2017-08-10 NOTE — Telephone Encounter (Signed)
Pt is calling to follow up on results. Please advise

## 2017-08-10 NOTE — Telephone Encounter (Signed)
Pt is calling due to missed call from [place]. Please advise ok to left detailed message on Voicemail . Best number to reach is at this time due to being at work is [phone]. General work number ask for patient

## 2017-08-11 ENCOUNTER — Telehealth: Payer: Self-pay | Admitting: Advanced Practice Midwife

## 2017-08-11 NOTE — Telephone Encounter (Signed)
Spoke with patient regarding her MyRisk results. Please see telephone record.

## 2017-08-11 NOTE — Telephone Encounter (Signed)
Spoke with patient and reviewed MyRisk results.

## 2018-05-21 DIAGNOSIS — G5601 Carpal tunnel syndrome, right upper limb: Secondary | ICD-10-CM | POA: Diagnosis not present

## 2018-05-21 DIAGNOSIS — M67431 Ganglion, right wrist: Secondary | ICD-10-CM | POA: Diagnosis not present

## 2018-05-21 DIAGNOSIS — M25531 Pain in right wrist: Secondary | ICD-10-CM | POA: Diagnosis not present

## 2018-06-04 DIAGNOSIS — M67431 Ganglion, right wrist: Secondary | ICD-10-CM | POA: Diagnosis not present

## 2018-06-06 MED ORDER — CEFAZOLIN SODIUM-DEXTROSE 2-4 GM/100ML-% IV SOLN
2.0000 g | Freq: Once | INTRAVENOUS | Status: AC
  Administered 2018-06-07: 2 g via INTRAVENOUS

## 2018-06-07 ENCOUNTER — Ambulatory Visit: Payer: 59 | Admitting: Certified Registered"

## 2018-06-07 ENCOUNTER — Encounter
Admission: RE | Disposition: A | Discharge status: Home / Self Care | Payer: Self-pay | Source: Home / Self Care | Attending: Orthopedic Surgery

## 2018-06-07 ENCOUNTER — Encounter: Payer: Self-pay | Admitting: Emergency Medicine

## 2018-06-07 ENCOUNTER — Other Ambulatory Visit: Payer: Self-pay

## 2018-06-07 ENCOUNTER — Ambulatory Visit
Admission: RE | Admit: 2018-06-07 | Discharge: 2018-06-07 | Disposition: A | Discharge status: Home / Self Care | Payer: 59 | Attending: Orthopedic Surgery | Admitting: Orthopedic Surgery

## 2018-06-07 DIAGNOSIS — D649 Anemia, unspecified: Secondary | ICD-10-CM | POA: Diagnosis not present

## 2018-06-07 DIAGNOSIS — Z803 Family history of malignant neoplasm of breast: Secondary | ICD-10-CM | POA: Diagnosis not present

## 2018-06-07 DIAGNOSIS — M67431 Ganglion, right wrist: Secondary | ICD-10-CM | POA: Insufficient documentation

## 2018-06-07 DIAGNOSIS — Z801 Family history of malignant neoplasm of trachea, bronchus and lung: Secondary | ICD-10-CM | POA: Diagnosis not present

## 2018-06-07 HISTORY — PX: GANGLION CYST EXCISION: SHX1691

## 2018-06-07 LAB — POCT PREGNANCY, URINE: Preg Test, Ur: NEGATIVE

## 2018-06-07 SURGERY — EXCISION, GANGLION CYST, WRIST
Anesthesia: General | Site: Arm Lower | Laterality: Right

## 2018-06-07 MED ORDER — HYDROCODONE-ACETAMINOPHEN 5-325 MG PO TABS: 1.0000 | ORAL_TABLET | Freq: Four times a day (QID) | ORAL | 0 refills | Status: DC | PRN

## 2018-06-07 MED ORDER — MIDAZOLAM HCL 2 MG/2ML IJ SOLN
INTRAMUSCULAR | Status: AC
  Filled 2018-06-07: qty 2

## 2018-06-07 MED ORDER — ONDANSETRON HCL 4 MG/2ML IJ SOLN
INTRAMUSCULAR | Status: AC
  Filled 2018-06-07: qty 2

## 2018-06-07 MED ORDER — ONDANSETRON HCL 4 MG/2ML IJ SOLN
INTRAMUSCULAR | Status: DC | PRN
  Administered 2018-06-07: 4 mg via INTRAVENOUS

## 2018-06-07 MED ORDER — PROPOFOL 10 MG/ML IV BOLUS
INTRAVENOUS | Status: AC
  Filled 2018-06-07: qty 20

## 2018-06-07 MED ORDER — HYDROCODONE-ACETAMINOPHEN 5-325 MG PO TABS
1.0000 | ORAL_TABLET | ORAL | Status: DC | PRN
  Administered 2018-06-07: 1 via ORAL

## 2018-06-07 MED ORDER — METOCLOPRAMIDE HCL 10 MG PO TABS: 5.0000 mg | ORAL_TABLET | Freq: Three times a day (TID) | ORAL | Status: DC | PRN

## 2018-06-07 MED ORDER — GLYCOPYRROLATE 0.2 MG/ML IJ SOLN
INTRAMUSCULAR | Status: AC
  Filled 2018-06-07: qty 1

## 2018-06-07 MED ORDER — BUPIVACAINE HCL 0.5 % IJ SOLN
INTRAMUSCULAR | Status: DC | PRN
  Administered 2018-06-07: 5 mL

## 2018-06-07 MED ORDER — FAMOTIDINE 20 MG PO TABS
ORAL_TABLET | ORAL | Status: AC
  Administered 2018-06-07: 20 mg via ORAL
  Filled 2018-06-07: qty 1

## 2018-06-07 MED ORDER — PROPOFOL 10 MG/ML IV BOLUS
INTRAVENOUS | Status: DC | PRN
  Administered 2018-06-07: 150 mg via INTRAVENOUS

## 2018-06-07 MED ORDER — ONDANSETRON HCL 4 MG/2ML IJ SOLN: 4.0000 mg | Freq: Once | INTRAMUSCULAR | Status: DC | PRN

## 2018-06-07 MED ORDER — GLYCOPYRROLATE 0.2 MG/ML IJ SOLN
INTRAMUSCULAR | Status: DC | PRN
  Administered 2018-06-07: 0.2 mg via INTRAVENOUS

## 2018-06-07 MED ORDER — FENTANYL CITRATE (PF) 100 MCG/2ML IJ SOLN
INTRAMUSCULAR | Status: AC
  Filled 2018-06-07: qty 2

## 2018-06-07 MED ORDER — SEVOFLURANE IN SOLN
RESPIRATORY_TRACT | Status: AC
  Filled 2018-06-07: qty 250

## 2018-06-07 MED ORDER — LIDOCAINE HCL (PF) 2 % IJ SOLN
INTRAMUSCULAR | Status: DC | PRN
  Administered 2018-06-07: 50 mg

## 2018-06-07 MED ORDER — BUPIVACAINE HCL (PF) 0.5 % IJ SOLN
INTRAMUSCULAR | Status: AC
  Filled 2018-06-07: qty 30

## 2018-06-07 MED ORDER — LIDOCAINE HCL (PF) 2 % IJ SOLN
INTRAMUSCULAR | Status: AC
  Filled 2018-06-07: qty 10

## 2018-06-07 MED ORDER — CEFAZOLIN SODIUM-DEXTROSE 2-4 GM/100ML-% IV SOLN
INTRAVENOUS | Status: AC
  Filled 2018-06-07: qty 100

## 2018-06-07 MED ORDER — ONDANSETRON HCL 4 MG/2ML IJ SOLN: 4.0000 mg | Freq: Four times a day (QID) | INTRAMUSCULAR | Status: DC | PRN

## 2018-06-07 MED ORDER — METOCLOPRAMIDE HCL 5 MG/ML IJ SOLN: 5.0000 mg | Freq: Three times a day (TID) | INTRAMUSCULAR | Status: DC | PRN

## 2018-06-07 MED ORDER — MIDAZOLAM HCL 5 MG/5ML IJ SOLN
INTRAMUSCULAR | Status: DC | PRN
  Administered 2018-06-07: 2 mg via INTRAVENOUS

## 2018-06-07 MED ORDER — FENTANYL CITRATE (PF) 100 MCG/2ML IJ SOLN
25.0000 ug | INTRAMUSCULAR | Status: DC | PRN
  Administered 2018-06-07 (×2): 25 ug via INTRAVENOUS

## 2018-06-07 MED ORDER — HYDROCODONE-ACETAMINOPHEN 5-325 MG PO TABS
ORAL_TABLET | ORAL | Status: AC
  Filled 2018-06-07: qty 1

## 2018-06-07 MED ORDER — FAMOTIDINE 20 MG PO TABS
20.0000 mg | ORAL_TABLET | Freq: Once | ORAL | Status: AC
  Administered 2018-06-07: 20 mg via ORAL

## 2018-06-07 MED ORDER — ONDANSETRON HCL 4 MG PO TABS: 4.0000 mg | ORAL_TABLET | Freq: Four times a day (QID) | ORAL | Status: DC | PRN

## 2018-06-07 MED ORDER — FENTANYL CITRATE (PF) 100 MCG/2ML IJ SOLN
INTRAMUSCULAR | Status: DC | PRN
  Administered 2018-06-07: 50 ug via INTRAVENOUS

## 2018-06-07 MED ORDER — LACTATED RINGERS IV SOLN
INTRAVENOUS | Status: DC
  Administered 2018-06-07: 08:00:00 via INTRAVENOUS

## 2018-06-07 MED ORDER — SODIUM CHLORIDE 0.9 % IV SOLN: INTRAVENOUS | Status: DC

## 2018-06-07 MED ORDER — DEXAMETHASONE SODIUM PHOSPHATE 10 MG/ML IJ SOLN
INTRAMUSCULAR | Status: DC | PRN
  Administered 2018-06-07: 5 mg via INTRAVENOUS

## 2018-06-07 SURGICAL SUPPLY — 29 items
BANDAGE ELASTIC 3 LF NS (GAUZE/BANDAGES/DRESSINGS) ×2 IMPLANT
CANISTER SUCT 1200ML W/VALVE (MISCELLANEOUS) ×2 IMPLANT
CAST PADDING 3X4FT ST 30246 (SOFTGOODS) ×1
CHLORAPREP W/TINT 26ML (MISCELLANEOUS) ×2 IMPLANT
COVER WAND RF STERILE (DRAPES) ×2 IMPLANT
CUFF TOURN 18 STER (MISCELLANEOUS) IMPLANT
ELECT CAUTERY NDL 2.0 MIC (NEEDLE) ×1 IMPLANT
ELECT CAUTERY NEEDLE 2.0 MIC (NEEDLE) ×2 IMPLANT
GAUZE PETRO XEROFOAM 1X8 (MISCELLANEOUS) ×2 IMPLANT
GAUZE SPONGE 4X4 12PLY STRL (GAUZE/BANDAGES/DRESSINGS) ×2 IMPLANT
GLOVE SURG SYN 9.0  PF PI (GLOVE) ×1
GLOVE SURG SYN 9.0 PF PI (GLOVE) ×1 IMPLANT
GOWN SRG 2XL LVL 4 RGLN SLV (GOWNS) ×1 IMPLANT
GOWN STRL NON-REIN 2XL LVL4 (GOWNS) ×1
GOWN STRL REUS W/ TWL LRG LVL3 (GOWN DISPOSABLE) ×1 IMPLANT
GOWN STRL REUS W/TWL LRG LVL3 (GOWN DISPOSABLE) ×1
KIT TURNOVER KIT A (KITS) ×2 IMPLANT
NS IRRIG 500ML POUR BTL (IV SOLUTION) ×2 IMPLANT
PACK EXTREMITY ARMC (MISCELLANEOUS) ×2 IMPLANT
PAD CAST CTTN 3X4 STRL (SOFTGOODS) ×1 IMPLANT
SCALPEL PROTECTED #15 DISP (BLADE) ×4 IMPLANT
SPLINT CAST 1 STEP 3X12 (MISCELLANEOUS) ×2 IMPLANT
SUT ETHILON 4-0 (SUTURE) ×1
SUT ETHILON 4-0 FS2 18XMFL BLK (SUTURE) ×1
SUT ETHILON 5-0 FS-2 18 BLK (SUTURE) ×2 IMPLANT
SUT MNCRL 4-0 (SUTURE) ×1
SUT MNCRL 4-018XMFL (SUTURE) ×1
SUTURE ETHLN 4-0 FS2 18XMF BLK (SUTURE) ×1 IMPLANT
SUTURE MNCRL 4-018XMF (SUTURE) ×1 IMPLANT

## 2018-06-07 NOTE — Anesthesia Procedure Notes (Signed)
Procedure Name: LMA Insertion Performed by: Tremaine Fuhriman, CRNA Pre-anesthesia Checklist: Patient identified, Patient being monitored, Timeout performed, Emergency Drugs available and Suction available Patient Re-evaluated:Patient Re-evaluated prior to induction Oxygen Delivery Method: Circle system utilized Preoxygenation: Pre-oxygenation with 100% oxygen Induction Type: IV induction Ventilation: Mask ventilation without difficulty LMA: LMA inserted LMA Size: 3.5 Tube type: Oral Number of attempts: 1 Placement Confirmation: positive ETCO2 and breath sounds checked- equal and bilateral Tube secured with: Tape Dental Injury: Teeth and Oropharynx as per pre-operative assessment        

## 2018-06-07 NOTE — Op Note (Signed)
06/07/2018  8:58 AM  PATIENT:  Gina Benson  32 y.o. female  PRE-OPERATIVE DIAGNOSIS:  RIGHT WRIST GANGLION  POST-OPERATIVE DIAGNOSIS:  RIGHT WRIST GANGLION  PROCEDURE:  Procedure(s): REMOVAL GANGLION OF WRIST (Right)  SURGEON: Laurene Footman, MD  ASSISTANTS: None  ANESTHESIA:   general  EBL:  Total I/O In: 400 [I.V.:400] Out: -   BLOOD ADMINISTERED:none  DRAINS: none   LOCAL MEDICATIONS USED:  MARCAINE     SPECIMEN:  No Specimen  DISPOSITION OF SPECIMEN:  N/A  COUNTS:  YES  TOURNIQUET:   17 minutes at 250 mmHg  IMPLANTS: None  DICTATION: .Dragon Dictation patient was brought to the operating room and after adequate general anesthesia was obtained the right arm was prepped and draped in the usual sterile fashion.  After patient identification and timeout procedures were completed, tourniquet was raised.  A transverse incision was made over the dorsal wrist centered over the ganglion and skin and subcutaneous tissue were spread.  The ganglion came out under the extensor retinaculum and the retinaculum was released proximally to allow for better exposure.  The cyst was then tracked down to the dorsum of the wrist at the radiocarpal joint and excised with thick fluid consistent with ganglion cyst removed with this.  Getting down to a small hole in the dorsum of the wrist capsule this was cauterized and abraded with a small elevator to try to cause scar and prevent recurrence.  The wound was then thoroughly irrigated and closed with 4-0 Monocryl in a subcuticular fashion followed by Dermabond.  After the Dermabond was dry Telfa was applied followed by 4 x 4's web roll and a splint holding the wrist in slight extension to try again aid in prevention of recurrence followed by an Ace wrap.  PLAN OF CARE: Discharge to home after PACU  PATIENT DISPOSITION:  PACU - hemodynamically stable.

## 2018-06-07 NOTE — Transfer of Care (Signed)
Immediate Anesthesia Transfer of Care Note  Patient: Gina Benson  Procedure(s) Performed: REMOVAL GANGLION OF WRIST (Right Arm Lower)  Patient Location: PACU  Anesthesia Type:General  Level of Consciousness: sedated  Airway & Oxygen Therapy: Patient Spontanous Breathing and Patient connected to face mask oxygen  Post-op Assessment: Report given to RN and Post -op Vital signs reviewed and stable  Post vital signs: Reviewed  Last Vitals:  Vitals Value Taken Time  BP 103/54 06/07/2018  9:03 AM  Temp    Pulse 71 06/07/2018  9:03 AM  Resp 26 06/07/2018  9:04 AM  SpO2 77 % 06/07/2018  9:03 AM  Vitals shown include unvalidated device data.  Last Pain:  Vitals:   06/07/18 0722  TempSrc: Oral  PainSc: 3          Complications: No apparent anesthesia complications

## 2018-06-07 NOTE — Anesthesia Post-op Follow-up Note (Signed)
Anesthesia QCDR form completed.        

## 2018-06-07 NOTE — H&P (Signed)
Reviewed paper H+P, will be scanned into chart. No changes noted.  

## 2018-06-07 NOTE — Discharge Instructions (Signed)
Keep splint clean and dry.  Work on finger motion is much as you would like but keep wrist in brace and try not to move it.  Pain medicine as directed.  Ice to the back of the wrist may help with pain today and tomorrow

## 2018-06-07 NOTE — Anesthesia Preprocedure Evaluation (Signed)
Anesthesia Evaluation  Patient identified by MRN, date of birth, ID band Patient awake    Reviewed: Allergy & Precautions, NPO status , Patient's Chart, lab work & pertinent test results  History of Anesthesia Complications Negative for: history of anesthetic complications  Airway Mallampati: II       Dental   Pulmonary neg sleep apnea, neg COPD,           Cardiovascular (-) hypertension(-) Past MI and (-) CHF (-) dysrhythmias (-) Valvular Problems/Murmurs     Neuro/Psych neg Seizures    GI/Hepatic Neg liver ROS, neg GERD  ,  Endo/Other  neg diabetes  Renal/GU negative Renal ROS     Musculoskeletal   Abdominal   Peds  Hematology  (+) anemia ,   Anesthesia Other Findings   Reproductive/Obstetrics                             Anesthesia Physical Anesthesia Plan  ASA: I  Anesthesia Plan: General   Post-op Pain Management:    Induction: Intravenous  PONV Risk Score and Plan: 3 and Dexamethasone, Ondansetron and Midazolam  Airway Management Planned: LMA  Additional Equipment:   Intra-op Plan:   Post-operative Plan:   Informed Consent: I have reviewed the patients History and Physical, chart, labs and discussed the procedure including the risks, benefits and alternatives for the proposed anesthesia with the patient or authorized representative who has indicated his/her understanding and acceptance.     Plan Discussed with:   Anesthesia Plan Comments:         Anesthesia Quick Evaluation

## 2018-06-07 NOTE — Anesthesia Postprocedure Evaluation (Signed)
Anesthesia Post Note  Patient: Gina Benson  Procedure(s) Performed: REMOVAL GANGLION OF WRIST (Right Arm Lower)  Patient location during evaluation: PACU Anesthesia Type: General Level of consciousness: awake and alert Pain management: pain level controlled Vital Signs Assessment: post-procedure vital signs reviewed and stable Respiratory status: spontaneous breathing and respiratory function stable Cardiovascular status: stable Anesthetic complications: no     Last Vitals:  Vitals:   06/07/18 0903 06/07/18 0911  BP: (!) 103/54   Pulse:  86  Resp: 20 15  Temp:    SpO2: 98% 97%    Last Pain:  Vitals:   06/07/18 0902  TempSrc:   PainSc: 0-No pain                 KEPHART,WILLIAM K

## 2018-06-07 NOTE — Progress Notes (Signed)
Disregard 9:34 in time

## 2018-06-07 NOTE — OR Nursing (Signed)
Discussed discharge instructions with patient and Mom. Both voice understnding.

## 2018-06-11 DIAGNOSIS — M67431 Ganglion, right wrist: Secondary | ICD-10-CM | POA: Diagnosis not present

## 2018-11-27 ENCOUNTER — Telehealth: Payer: Self-pay | Admitting: Internal Medicine

## 2018-11-27 NOTE — Telephone Encounter (Signed)
Patient called today to make sure our office has her shot records from previous doctor.  She stated she did not see any updates in my chart with her list of immunizations .   Please advise   C/B Number - [phone]

## 2018-11-27 NOTE — Telephone Encounter (Signed)
Pt is 33 yo, we would have not asked for immunizations and she lived in [place] previously

## 2018-11-28 NOTE — Telephone Encounter (Signed)
Med rec release email to pt and she was given fax number to return

## 2018-12-18 ENCOUNTER — Other Ambulatory Visit (HOSPITAL_COMMUNITY): Payer: Self-pay | Admitting: Occupational Medicine

## 2018-12-18 ENCOUNTER — Ambulatory Visit (HOSPITAL_COMMUNITY)
Admission: RE | Admit: 2018-12-18 | Discharge: 2018-12-18 | Disposition: A | Discharge status: Home / Self Care | Payer: Self-pay | Source: Ambulatory Visit | Attending: Occupational Medicine | Admitting: Occupational Medicine

## 2018-12-18 ENCOUNTER — Other Ambulatory Visit: Payer: Self-pay

## 2018-12-18 DIAGNOSIS — R7612 Nonspecific reaction to cell mediated immunity measurement of gamma interferon antigen response without active tuberculosis: Secondary | ICD-10-CM

## 2019-02-01 DIAGNOSIS — H5203 Hypermetropia, bilateral: Secondary | ICD-10-CM | POA: Diagnosis not present

## 2019-05-29 ENCOUNTER — Ambulatory Visit: Payer: BLUE CROSS/BLUE SHIELD | Admitting: Advanced Practice Midwife

## 2019-06-18 ENCOUNTER — Ambulatory Visit: Payer: BLUE CROSS/BLUE SHIELD | Admitting: Advanced Practice Midwife

## 2020-01-23 DIAGNOSIS — H5203 Hypermetropia, bilateral: Secondary | ICD-10-CM | POA: Diagnosis not present

## 2020-01-31 ENCOUNTER — Other Ambulatory Visit: Payer: Self-pay

## 2020-02-19 ENCOUNTER — Ambulatory Visit: Payer: Self-pay | Admitting: Advanced Practice Midwife

## 2020-03-28 ENCOUNTER — Ambulatory Visit: Payer: Self-pay | Attending: Internal Medicine

## 2020-03-28 ENCOUNTER — Other Ambulatory Visit: Payer: Self-pay

## 2020-03-28 DIAGNOSIS — Z23 Encounter for immunization: Secondary | ICD-10-CM

## 2020-03-28 NOTE — Progress Notes (Signed)
   Covid-19 Vaccination Clinic  Name:  Tayja Manzer    MRN: [id number] DOB: 1986/04/19  03/28/2020  Ms. Holness was observed post Covid-19 immunization for 15 minutes without incident. She was provided with Vaccine Information Sheet and instruction to access the V-Safe system.   Ms. Pita was instructed to call 911 with any severe reactions post vaccine: Marland Kitchen Difficulty breathing  . Swelling of face and throat  . A fast heartbeat  . A bad rash all over body  . Dizziness and weakness

## 2020-04-20 DIAGNOSIS — G5601 Carpal tunnel syndrome, right upper limb: Secondary | ICD-10-CM | POA: Diagnosis not present

## 2020-07-02 ENCOUNTER — Other Ambulatory Visit (HOSPITAL_COMMUNITY): Payer: Self-pay | Admitting: Advanced Practice Midwife

## 2020-07-02 ENCOUNTER — Other Ambulatory Visit: Payer: Self-pay

## 2020-07-02 ENCOUNTER — Encounter: Payer: Self-pay | Admitting: Advanced Practice Midwife

## 2020-07-02 ENCOUNTER — Other Ambulatory Visit (HOSPITAL_COMMUNITY)
Admission: RE | Admit: 2020-07-02 | Discharge: 2020-07-02 | Disposition: A | Discharge status: Home / Self Care | Payer: 59 | Source: Ambulatory Visit | Attending: Advanced Practice Midwife | Admitting: Advanced Practice Midwife

## 2020-07-02 ENCOUNTER — Ambulatory Visit (INDEPENDENT_AMBULATORY_CARE_PROVIDER_SITE_OTHER): Payer: 59 | Admitting: Advanced Practice Midwife

## 2020-07-02 VITALS — BP 120/80 | Ht 64.0 in | Wt 204.0 lb

## 2020-07-02 DIAGNOSIS — Z01419 Encounter for gynecological examination (general) (routine) without abnormal findings: Secondary | ICD-10-CM

## 2020-07-02 DIAGNOSIS — Z124 Encounter for screening for malignant neoplasm of cervix: Secondary | ICD-10-CM

## 2020-07-02 DIAGNOSIS — Z86018 Personal history of other benign neoplasm: Secondary | ICD-10-CM

## 2020-07-02 DIAGNOSIS — N920 Excessive and frequent menstruation with regular cycle: Secondary | ICD-10-CM

## 2020-07-02 MED ORDER — NORGESTIMATE-ETH ESTRADIOL 0.25-35 MG-MCG PO TABS: 1.0000 | ORAL_TABLET | Freq: Every day | ORAL | 3 refills | Status: DC

## 2020-07-02 NOTE — Patient Instructions (Signed)
Menorrhagia Menorrhagia is a form of abnormal uterine bleeding in which menstrual periods are heavy or last longer than normal. With menorrhagia, the periods may cause enough blood loss and cramping that a woman becomes unable to take part in her usual activities. What are the causes? Common causes of this condition include:  Polyps or fibroids. These are noncancerous growths in the uterus.  An imbalance of the hormones estrogen and progesterone.  Anovulation, which occurs when one of the ovaries does not release an egg during one or more months.  A problem with the thyroid gland (hypothyroidism).  Side effects of having an intrauterine device (IUD).  Side effects of some medicines, such as NSAIDs or blood thinners.  A bleeding disorder that stops the blood from clotting normally. In some cases, the cause of this condition is not known. What increases the risk? You are more likely to develop this condition if you have cancer of the uterus. What are the signs or symptoms? Symptoms of this condition include:  Routinely having to change your pad or tampon every 1-2 hours because it is soaked.  Needing to use pads and tampons at the same time because of heavy bleeding.  Needing to wake up to change your pads or tampons during the night.  Passing blood clots larger than 1 inch (2.5 cm) in size.  Having bleeding that lasts for more than 7 days.  Having symptoms of low iron levels (anemia), such as tiredness (fatigue) or shortness of breath. How is this diagnosed? This condition may be diagnosed based on:  A physical exam.  Your symptoms and menstrual history.  Tests, such as: ? Blood tests to check if you are pregnant or if you have hormonal changes, a bleeding or thyroid disorder, anemia, or other problems. ? Pap test to check for cancerous changes, infections, or inflammation. ? Endometrial biopsy. This test involves removing a tissue sample from the lining of the uterus  (endometrium) to be examined under a microscope. ? Pelvic ultrasound. This test uses sound waves to create images of your uterus, ovaries, and vagina. The images can show if you have fibroids or other growths. ? Hysteroscopy. For this test, a thin, flexible tube with a light on the end (hysteroscope) is used to look inside your uterus. How is this treated? Treatment may not be needed for this condition. If it is needed, the best treatment for you will depend on:  Whether you need to prevent pregnancy.  Your desire to have children in the future.  The cause and severity of your bleeding.  Your personal preference. Medicine Medicines are the first step in treatment. You may be treated with:  Hormonal birth control methods. These treatments reduce bleeding during your menstrual period. They include: ? Birth control pills. ? Skin patch. ? Vaginal ring. ? Shots (injections) that you get every 3 months. ? Hormonal IUD. ? Implants that go under the skin.  Medicines that thicken the blood and slow bleeding.  Medicines that reduce swelling, such as ibuprofen.  Medicines that contain an artificial (synthetic) hormone called progestin.  Medicines that make the ovaries stop working for a short time.  Iron supplements to treat anemia.   Surgery If medicines do not work, surgery may be done. Surgical options may include:  Dilation and curettage (D&C). In this procedure, your health care provider opens the lowest part of the uterus (cervix) and then scrapes or suctions tissue from the endometrium. This reduces menstrual bleeding.  Operative hysteroscopy. In this procedure,   a hysteroscope is used to view your uterus and help remove polyps that may be causing heavy periods.  Endometrial ablation. This is when various techniques are used to permanently destroy your entire endometrium. After endometrial ablation, most women have little or no menstrual flow. This procedure reduces your ability to  become pregnant.  Endometrial resection. In this procedure, an electrosurgical wire loop is used to remove the endometrium. This procedure reduces your ability to become pregnant.  Hysterectomy. This is surgical removal of your uterus. This is a permanent procedure that stops menstrual periods. Pregnancy is not possible after a hysterectomy. Follow these instructions at home: Medicines  Take over-the-counter and prescription medicines only as told by your health care provider. This includes iron pills.  Do not change or switch medicines without asking your health care provider.  Do not take aspirin or medicines that contain aspirin 1 week before or during your menstrual period. Aspirin may make bleeding worse. Managing constipation Your iron pills may cause constipation. If you are taking prescription iron supplements, you may need to take these actions to prevent or treat constipation:  Drink enough fluid to keep your urine pale yellow.  Take over-the-counter or prescription medicines.  Eat foods that are high in fiber, such as beans, whole grains, and fresh fruits and vegetables.  Limit foods that are high in fat and processed sugars, such as fried or sweet foods. General instructions  If you need to change your sanitary pad or tampon more than once every 2 hours, limit your activity until the bleeding stops.  Eat well-balanced meals, including foods that are high in iron. Foods that have a lot of iron include leafy green vegetables, meat, liver, eggs, and whole-grain breads and cereals.  Do not try to lose weight until the abnormal bleeding has stopped and your blood iron level is back to normal. If you need to lose weight, work with your health care provider to lose weight safely.  Keep all follow-up visits. This is important. Contact a health care provider if:  You soak through a pad or tampon every 1 or 2 hours, and this happens every time you have a period.  You need to use  pads and tampons at the same time because you are bleeding so much.  You have nausea, vomiting, diarrhea, or other problems related to medicines you are taking. Get help right away if:  You soak through more than a pad or tampon in 1 hour.  You pass clots bigger than 1 inch (2.5 cm) wide.  You feel short of breath.  You feel like your heart is beating too fast.  You feel dizzy or you faint.  You feel very weak or tired. Summary  Menorrhagia is a form of abnormal uterine bleeding in which menstrual periods are heavy or last longer than normal.  Treatment may not be needed for this condition. If it is needed, it may include medicines or procedures.  Take over-the-counter and prescription medicines only as told by your health care provider. This includes iron pills.  Get help right away if you have heavy bleeding that soaks through more than a pad or tampon in 1 hour, you pass large clots, or you feel dizzy, short of breath, or very weak or tired. This information is not intended to replace advice given to you by your health care provider. Make sure you discuss any questions you have with your health care provider. Document Revised: 02/11/2020 Document Reviewed: 02/11/2020 Elsevier Patient Education  2021   Elsevier Inc.  

## 2020-07-03 NOTE — Progress Notes (Addendum)
Gynecology Annual Exam   Date of Service: 07/02/2020  PCP: Jearld Fenton, NP  Chief Complaint:  Chief Complaint  Patient presents with  . Dysmenorrhea    History of Present Illness: Patient is a 35 y.o. [id number] presents for annual exam. The patient has complaints today of continued heavy and prolonged menstrual bleeding. Her periods are monthly and last for 6-12 days. Most days are heavy with cramping and clots. She has a history of uterine fibroids and requests gyn ultrasound for evaluation. She would also like to start OCP for cycle control. She has history of tubal ligation for birth control. She may consider other intervention for uterine bleeding after ultrasound results.   LMP: Patient's last menstrual period was 06/25/2020.  Postcoital Bleeding: no Dysmenorrhea: yes  The patient is sexually active. She currently uses tubal ligation for contraception. She denies dyspareunia.  The patient does perform self breast exams.  There is notable family history of breast or ovarian cancer in her family. The patient has had MyRisk testing and is negative for BRCA gene. There were some gene variations of unknown significance.  The patient wears seatbelts: yes.   The patient has regular exercise: she walks her dog, her diet is getting better and she mainly drinks coffee. Her amount of sleep is improving (new puppy recently has caused disrupted sleep).    The patient denies current symptoms of depression.    Review of Systems: Review of Systems  Constitutional: Negative for chills and fever.  HENT: Negative for congestion, ear discharge, ear pain, hearing loss, sinus pain and sore throat.   Eyes: Negative for blurred vision and double vision.  Respiratory: Negative for cough, shortness of breath and wheezing.   Cardiovascular: Negative for chest pain, palpitations and leg swelling.  Gastrointestinal: Negative for abdominal pain, blood in stool, constipation, diarrhea, heartburn,  melena, nausea and vomiting.  Genitourinary: Negative for dysuria, flank pain, frequency, hematuria and urgency.       Positive for heavy menstrual bleeding  Musculoskeletal: Negative for back pain, joint pain and myalgias.  Skin: Negative for itching and rash.  Neurological: Negative for dizziness, tingling, tremors, sensory change, speech change, focal weakness, seizures, loss of consciousness, weakness and headaches.  Endo/Heme/Allergies: Negative for environmental allergies. Does not bruise/bleed easily.  Psychiatric/Behavioral: Negative for depression, hallucinations, memory loss, substance abuse and suicidal ideas. The patient is not nervous/anxious and does not have insomnia.     Past Medical History:  There are no problems to display for this patient.   Past Surgical History:  Past Surgical History:  Procedure Laterality Date  . CERVICAL BIOPSY  W/ LOOP ELECTRODE EXCISION    . GANGLION CYST EXCISION Right 06/07/2018   Procedure: REMOVAL GANGLION OF WRIST;  Surgeon: Hessie Knows, MD;  Location: [facility] ORS;  Service: Orthopedics;  Laterality: Right;  . TUBAL LIGATION  2009    Gynecologic History:  Patient's last menstrual period was 06/25/2020. Contraception: tubal ligation Last Pap: 3 years ago Results were: no abnormalities   Obstetric History: [id number]  Family History:  Family History  Problem Relation Age of Onset  . Alcohol abuse Mother   . Cancer Mother        breast x2  . Alcohol abuse Father   . Alcohol abuse Maternal Grandmother   . Cancer Maternal Grandmother        lung    Social History:  Social History   Socioeconomic History  . Marital status: Married    Spouse name: Not  on file  . Number of children: Not on file  . Years of education: Not on file  . Highest education level: Not on file  Occupational History  . Not on file  Tobacco Use  . Smoking status: Never Smoker  . Smokeless tobacco: Never Used  Vaping Use  . Vaping Use: Never used   Substance and Sexual Activity  . Alcohol use: Yes    Alcohol/week: 0.0 standard drinks    Comment: occasional  . Drug use: No  . Sexual activity: Yes    Birth control/protection: Surgical  Other Topics Concern  . Not on file  Social History Narrative  . Not on file   Social Determinants of Health   Financial Resource Strain: Not on file  Food Insecurity: Not on file  Transportation Needs: Not on file  Physical Activity: Not on file  Stress: Not on file  Social Connections: Not on file  Intimate Partner Violence: Not on file    Allergies:  No Known Allergies  Medications: Prior to Admission medications   Medication Sig Start Date End Date Taking? Authorizing Provider  norgestimate-ethinyl estradiol (ORTHO-CYCLEN) 0.25-35 MG-MCG tablet Take 1 tablet by mouth daily. 07/02/20  Yes Rod Can, CNM  HYDROcodone-acetaminophen (NORCO) 5-325 MG tablet Take 1 tablet by mouth every 6 (six) hours as needed for moderate pain. Patient not taking: Reported on 07/02/2020 06/07/18   Hessie Knows, MD    Physical Exam Vitals: Blood pressure 120/80, height '5\' 4"'  (1.626 m), weight 204 lb (92.5 kg), last menstrual period 06/25/2020.  General: NAD HEENT: normocephalic, anicteric Thyroid: no enlargement, no palpable nodules Pulmonary: No increased work of breathing, CTAB Cardiovascular: RRR, distal pulses 2+ Breast: Breast symmetrical, no tenderness, no palpable nodules or masses, no skin or nipple retraction present, no nipple discharge.  No axillary or supraclavicular lymphadenopathy. Abdomen: NABS, soft, non-tender, non-distended.  Umbilicus without lesions.  No hepatomegaly, splenomegaly or masses palpable. No evidence of hernia  Genitourinary:  External: Normal external female genitalia.  Normal urethral meatus, normal Bartholin's and Skene's glands.    Vagina: Normal vaginal mucosa, no evidence of prolapse.    Cervix: Grossly normal in appearance, no bleeding, no CMT  Uterus:  unable to determine if enlarged due to body habitus   Adnexa: deferred  Rectal: deferred  Lymphatic: no evidence of inguinal lymphadenopathy Extremities: no edema, erythema, or tenderness Neurologic: Grossly intact Psychiatric: mood appropriate, affect full    Assessment: 35 y.o. G3P3003 routine annual exam  Plan: Problem List Items Addressed This Visit   None   Visit Diagnoses    Well woman exam with routine gynecological exam    -  Primary   Relevant Orders   US PELVIS TRANSVAGINAL NON-OB (TV ONLY)   Cytology - PAP   History of uterine fibroid       Relevant Orders   US PELVIS TRANSVAGINAL NON-OB (TV ONLY)   Screening for cervical cancer       Relevant Orders   Cytology - PAP   Menorrhagia with regular cycle       Relevant Medications   norgestimate-ethinyl estradiol (ORTHO-CYCLEN) 0.25-35 MG-MCG tablet      1) STI screening  was offered and declined  2)  ASCCP guidelines and rationale discussed.  Patient opts for every 3 years screening interval  3) Contraception - the patient is currently using  tubal ligation.  She is happy with her current form of contraception and plans to continue. She requests OCP for cycle control.  4) Routine healthcare  maintenance including cholesterol, diabetes screening discussed Declines  5) Return in about 1 week (around 07/09/2020) for gyn u/s and f/u visit.   Rod Can, [facility] 07/03/2020, 9:47 AM

## 2020-07-06 LAB — CYTOLOGY - PAP
Comment: NEGATIVE
Diagnosis: NEGATIVE
Diagnosis: REACTIVE
High risk HPV: NEGATIVE

## 2020-07-10 ENCOUNTER — Encounter: Payer: Self-pay | Admitting: Advanced Practice Midwife

## 2020-07-10 ENCOUNTER — Other Ambulatory Visit: Payer: Self-pay

## 2020-07-10 ENCOUNTER — Other Ambulatory Visit: Payer: Self-pay | Admitting: Advanced Practice Midwife

## 2020-07-10 ENCOUNTER — Ambulatory Visit (INDEPENDENT_AMBULATORY_CARE_PROVIDER_SITE_OTHER): Payer: 59

## 2020-07-10 ENCOUNTER — Ambulatory Visit (INDEPENDENT_AMBULATORY_CARE_PROVIDER_SITE_OTHER): Payer: 59 | Admitting: Advanced Practice Midwife

## 2020-07-10 VITALS — BP 132/76 | HR 78 | Ht 64.0 in | Wt 194.0 lb

## 2020-07-10 DIAGNOSIS — Z86018 Personal history of other benign neoplasm: Secondary | ICD-10-CM

## 2020-07-10 DIAGNOSIS — Z01419 Encounter for gynecological examination (general) (routine) without abnormal findings: Secondary | ICD-10-CM

## 2020-07-10 DIAGNOSIS — Z09 Encounter for follow-up examination after completed treatment for conditions other than malignant neoplasm: Secondary | ICD-10-CM

## 2020-07-10 NOTE — Progress Notes (Signed)
 Patient ID: Gina Benson, female   DOB: 09/13/1985, 34 y.o.   MRN: [id number]  Reason for Consult: Follow-up    Subjective:  HPI:  Gina Benson is a 34 y.o. female being seen for follow up visit after gyn ultrasound. She mentioned heavy menstrual bleeding at last visit and a history of fibroids. Today's pelvic ultrasound was normal- no evidence of fibroids. She will try the OCPs to regulate cycle and then follow up as needed for other treatment if bleeding continues to be heavy.   Past Medical History:  Diagnosis Date  . Abnormal Pap smear of cervix   . Anemia   . BRCA negative 07/2017   MyRisk neg except CDK4 VUS  . Chicken pox   . Family history of breast cancer   . Fibroids   . Increased risk of breast cancer 07/2017   IBIS=23.2%  . Menorrhagia    Family History  Problem Relation Age of Onset  . Alcohol abuse Mother   . Cancer Mother        breast x2  . Alcohol abuse Father   . Alcohol abuse Maternal Grandmother   . Cancer Maternal Grandmother        lung   Past Surgical History:  Procedure Laterality Date  . CERVICAL BIOPSY  W/ LOOP ELECTRODE EXCISION    . GANGLION CYST EXCISION Right 06/07/2018   Procedure: REMOVAL GANGLION OF WRIST;  Surgeon: Menz, Michael, MD;  Location: [facility] ORS;  Service: Orthopedics;  Laterality: Right;  . TUBAL LIGATION  2009    Short Social History:  Social History   Tobacco Use  . Smoking status: Never Smoker  . Smokeless tobacco: Never Used  Substance Use Topics  . Alcohol use: Yes    Alcohol/week: 0.0 standard drinks    Comment: occasional    No Known Allergies  Current Outpatient Medications  Medication Sig Dispense Refill  . norgestimate-ethinyl estradiol (ORTHO-CYCLEN) 0.25-35 MG-MCG tablet Take 1 tablet by mouth daily. 90 tablet 3   No current facility-administered medications for this visit.    Review of Systems  Constitutional: Negative for chills and fever.  HENT: Negative for congestion, ear discharge, ear  pain, hearing loss, sinus pain and sore throat.   Eyes: Negative for blurred vision and double vision.  Respiratory: Negative for cough, shortness of breath and wheezing.   Cardiovascular: Negative for chest pain, palpitations and leg swelling.  Gastrointestinal: Negative for abdominal pain, blood in stool, constipation, diarrhea, heartburn, melena, nausea and vomiting.  Genitourinary: Negative for dysuria, flank pain, frequency, hematuria and urgency.  Musculoskeletal: Negative for back pain, joint pain and myalgias.  Skin: Negative for itching and rash.  Neurological: Negative for dizziness, tingling, tremors, sensory change, speech change, focal weakness, seizures, loss of consciousness, weakness and headaches.  Endo/Heme/Allergies: Negative for environmental allergies. Does not bruise/bleed easily.  Psychiatric/Behavioral: Negative for depression, hallucinations, memory loss, substance abuse and suicidal ideas. The patient is not nervous/anxious and does not have insomnia.         Objective:  Objective   Vitals:   07/10/20 1626  BP: 132/76  Pulse: 78  SpO2: 98%  Weight: 194 lb (88 kg)  Height: 5' 4" (1.626 m)   Body mass index is 33.3 kg/m. Constitutional: Well nourished, well developed female in no acute distress.  HEENT: normal Skin: Warm and dry.  Respiratory:  Normal respiratory effort Neuro: DTRs 2+, Cranial nerves grossly intact Psych: Alert and Oriented x3. No memory deficits. Normal mood and affect.  MS: normal   gait, normal bilateral lower extremity ROM/strength/stability.  Data:  Patient Name: Gina Benson DOB: 06/18/1985 MRN: [id number]   ULTRASOUND REPORT  Location: [facility]  Date of Service: 07/10/2020   Indications:Abnormal Uterine Bleeding and Pain  Findings:  The uterus is anteverted and measures 9.1 x 6.1 x 5.4 cm. Echo texture is homogenous without evidence of focal masses. The Endometrium measures 10.7 mm.  Right Ovary measures 2.7  x 2.5 x 2.0 cm. It is normal in appearance. Left Ovary measures 3.8 x 2.6 x 2.3 cm. It is normal in appearance. Survey of the adnexa demonstrates no adnexal masses. There is no free fluid in the cul de sac.  Impression: 1. Normal pelvic ultrasound.   Recommendations: 1.Clinical correlation with the patient's History and Physical Exam.  Elyse S Fairbanks, RT   Assessment/Plan:     34 y.o. G3 P3003 female with normal gyn ultrasound  Start OCPs Follow up as needed and for annual exam   Jane Gledhill CNM [facility] 07/10/2020, 4:57 PM  

## 2020-09-02 ENCOUNTER — Other Ambulatory Visit (HOSPITAL_COMMUNITY): Payer: Self-pay | Admitting: Oral and Maxillofacial Surgery

## 2020-10-30 DIAGNOSIS — M9903 Segmental and somatic dysfunction of lumbar region: Secondary | ICD-10-CM | POA: Diagnosis not present

## 2020-10-30 DIAGNOSIS — M9905 Segmental and somatic dysfunction of pelvic region: Secondary | ICD-10-CM | POA: Diagnosis not present

## 2020-10-30 DIAGNOSIS — M531 Cervicobrachial syndrome: Secondary | ICD-10-CM | POA: Diagnosis not present

## 2020-10-30 DIAGNOSIS — M9902 Segmental and somatic dysfunction of thoracic region: Secondary | ICD-10-CM | POA: Diagnosis not present

## 2020-10-30 DIAGNOSIS — M5417 Radiculopathy, lumbosacral region: Secondary | ICD-10-CM | POA: Diagnosis not present

## 2020-10-30 DIAGNOSIS — M9901 Segmental and somatic dysfunction of cervical region: Secondary | ICD-10-CM | POA: Diagnosis not present

## 2020-10-30 DIAGNOSIS — R519 Headache, unspecified: Secondary | ICD-10-CM | POA: Diagnosis not present

## 2020-10-30 DIAGNOSIS — M62830 Muscle spasm of back: Secondary | ICD-10-CM | POA: Diagnosis not present

## 2020-11-03 DIAGNOSIS — R519 Headache, unspecified: Secondary | ICD-10-CM | POA: Diagnosis not present

## 2020-11-03 DIAGNOSIS — M9901 Segmental and somatic dysfunction of cervical region: Secondary | ICD-10-CM | POA: Diagnosis not present

## 2020-11-03 DIAGNOSIS — M5417 Radiculopathy, lumbosacral region: Secondary | ICD-10-CM | POA: Diagnosis not present

## 2020-11-03 DIAGNOSIS — M9903 Segmental and somatic dysfunction of lumbar region: Secondary | ICD-10-CM | POA: Diagnosis not present

## 2020-11-03 DIAGNOSIS — M531 Cervicobrachial syndrome: Secondary | ICD-10-CM | POA: Diagnosis not present

## 2020-11-03 DIAGNOSIS — M9905 Segmental and somatic dysfunction of pelvic region: Secondary | ICD-10-CM | POA: Diagnosis not present

## 2020-11-03 DIAGNOSIS — M62830 Muscle spasm of back: Secondary | ICD-10-CM | POA: Diagnosis not present

## 2020-11-03 DIAGNOSIS — M9902 Segmental and somatic dysfunction of thoracic region: Secondary | ICD-10-CM | POA: Diagnosis not present

## 2020-11-04 DIAGNOSIS — M531 Cervicobrachial syndrome: Secondary | ICD-10-CM | POA: Diagnosis not present

## 2020-11-04 DIAGNOSIS — M9903 Segmental and somatic dysfunction of lumbar region: Secondary | ICD-10-CM | POA: Diagnosis not present

## 2020-11-04 DIAGNOSIS — M9905 Segmental and somatic dysfunction of pelvic region: Secondary | ICD-10-CM | POA: Diagnosis not present

## 2020-11-04 DIAGNOSIS — M5417 Radiculopathy, lumbosacral region: Secondary | ICD-10-CM | POA: Diagnosis not present

## 2020-11-04 DIAGNOSIS — M9901 Segmental and somatic dysfunction of cervical region: Secondary | ICD-10-CM | POA: Diagnosis not present

## 2020-11-04 DIAGNOSIS — M9902 Segmental and somatic dysfunction of thoracic region: Secondary | ICD-10-CM | POA: Diagnosis not present

## 2020-11-04 DIAGNOSIS — R519 Headache, unspecified: Secondary | ICD-10-CM | POA: Diagnosis not present

## 2020-11-04 DIAGNOSIS — M62830 Muscle spasm of back: Secondary | ICD-10-CM | POA: Diagnosis not present

## 2020-11-06 DIAGNOSIS — M9903 Segmental and somatic dysfunction of lumbar region: Secondary | ICD-10-CM | POA: Diagnosis not present

## 2020-11-06 DIAGNOSIS — M9905 Segmental and somatic dysfunction of pelvic region: Secondary | ICD-10-CM | POA: Diagnosis not present

## 2020-11-06 DIAGNOSIS — M62830 Muscle spasm of back: Secondary | ICD-10-CM | POA: Diagnosis not present

## 2020-11-06 DIAGNOSIS — M9901 Segmental and somatic dysfunction of cervical region: Secondary | ICD-10-CM | POA: Diagnosis not present

## 2020-11-06 DIAGNOSIS — M531 Cervicobrachial syndrome: Secondary | ICD-10-CM | POA: Diagnosis not present

## 2020-11-06 DIAGNOSIS — M5417 Radiculopathy, lumbosacral region: Secondary | ICD-10-CM | POA: Diagnosis not present

## 2020-11-06 DIAGNOSIS — R519 Headache, unspecified: Secondary | ICD-10-CM | POA: Diagnosis not present

## 2020-11-06 DIAGNOSIS — M9902 Segmental and somatic dysfunction of thoracic region: Secondary | ICD-10-CM | POA: Diagnosis not present

## 2020-11-11 DIAGNOSIS — M9903 Segmental and somatic dysfunction of lumbar region: Secondary | ICD-10-CM | POA: Diagnosis not present

## 2020-11-11 DIAGNOSIS — M9905 Segmental and somatic dysfunction of pelvic region: Secondary | ICD-10-CM | POA: Diagnosis not present

## 2020-11-11 DIAGNOSIS — M62830 Muscle spasm of back: Secondary | ICD-10-CM | POA: Diagnosis not present

## 2020-11-11 DIAGNOSIS — M9901 Segmental and somatic dysfunction of cervical region: Secondary | ICD-10-CM | POA: Diagnosis not present

## 2020-11-11 DIAGNOSIS — M531 Cervicobrachial syndrome: Secondary | ICD-10-CM | POA: Diagnosis not present

## 2020-11-11 DIAGNOSIS — M5417 Radiculopathy, lumbosacral region: Secondary | ICD-10-CM | POA: Diagnosis not present

## 2020-11-11 DIAGNOSIS — R519 Headache, unspecified: Secondary | ICD-10-CM | POA: Diagnosis not present

## 2020-11-11 DIAGNOSIS — M9902 Segmental and somatic dysfunction of thoracic region: Secondary | ICD-10-CM | POA: Diagnosis not present

## 2020-11-13 DIAGNOSIS — M9902 Segmental and somatic dysfunction of thoracic region: Secondary | ICD-10-CM | POA: Diagnosis not present

## 2020-11-13 DIAGNOSIS — M9901 Segmental and somatic dysfunction of cervical region: Secondary | ICD-10-CM | POA: Diagnosis not present

## 2020-11-13 DIAGNOSIS — M62830 Muscle spasm of back: Secondary | ICD-10-CM | POA: Diagnosis not present

## 2020-11-13 DIAGNOSIS — R519 Headache, unspecified: Secondary | ICD-10-CM | POA: Diagnosis not present

## 2020-11-13 DIAGNOSIS — M9903 Segmental and somatic dysfunction of lumbar region: Secondary | ICD-10-CM | POA: Diagnosis not present

## 2020-11-13 DIAGNOSIS — M5417 Radiculopathy, lumbosacral region: Secondary | ICD-10-CM | POA: Diagnosis not present

## 2020-11-13 DIAGNOSIS — M531 Cervicobrachial syndrome: Secondary | ICD-10-CM | POA: Diagnosis not present

## 2020-11-13 DIAGNOSIS — M9905 Segmental and somatic dysfunction of pelvic region: Secondary | ICD-10-CM | POA: Diagnosis not present

## 2020-11-16 ENCOUNTER — Ambulatory Visit (INDEPENDENT_AMBULATORY_CARE_PROVIDER_SITE_OTHER): Payer: 59 | Admitting: Family Medicine

## 2020-11-16 ENCOUNTER — Encounter: Payer: Self-pay | Admitting: Family Medicine

## 2020-11-16 ENCOUNTER — Other Ambulatory Visit: Payer: Self-pay

## 2020-11-16 VITALS — BP 100/62 | HR 79 | Temp 98.6°F | Ht 64.5 in | Wt 208.5 lb

## 2020-11-16 DIAGNOSIS — R5383 Other fatigue: Secondary | ICD-10-CM | POA: Diagnosis not present

## 2020-11-16 DIAGNOSIS — Z13220 Encounter for screening for lipoid disorders: Secondary | ICD-10-CM | POA: Diagnosis not present

## 2020-11-16 DIAGNOSIS — Z0000 Encounter for general adult medical examination without abnormal findings: Secondary | ICD-10-CM

## 2020-11-16 DIAGNOSIS — Z131 Encounter for screening for diabetes mellitus: Secondary | ICD-10-CM

## 2020-11-16 NOTE — Progress Notes (Signed)
Zayed Griffie T. Paizlee Kinder, MD, [place] at [facility] [place], [address]  Phone: [phone]  FAX: [phone]  Sharlee Rufino - 35 y.o. female  MRN [id number]  Date of Birth: 02/18/1986  Date: 11/16/2020  PCP: Jearld Fenton, NP  Referral: Jearld Fenton, NP  Chief Complaint  Patient presents with  . Annual Exam    This visit occurred during the SARS-CoV-2 public health emergency.  Safety protocols were in place, including screening questions prior to the visit, additional usage of staff PPE, and extensive cleaning of exam room while observing appropriate contact time as indicated for disinfecting solutions.   Patient Care Team: Jearld Fenton, NP as PCP - General (Internal Medicine) Subjective:   Fawna Cranmer is a 35 y.o. pleasant patient who presents with the following:  Health Maintenance Summary Reviewed and updated, unless pt declines services.  Tobacco History Reviewed. Non-smoker Alcohol: No concerns, no excessive use - not much Exercise Habits: per below STD concerns: none Drug Use: None Lumps or breast concerns: no  Broad lab work-up.  Walking a puppy. 22 month old [place].  O/w not much physical activity.  Married.  4 children 20 - 13.  Health Maintenance  Topic Date Due  . Hepatitis C Screening  07/02/2021 (Originally 12/15/2002)  . HIV Screening  07/02/2021 (Originally 12/15/1999)  . INFLUENZA VACCINE  01/11/2021  . PAP SMEAR-Modifier  07/03/2023  . TETANUS/TDAP  12/11/2028  . Zoster Vaccines- Shingrix (1 of 2) 12/15/2034  . COVID-19 Vaccine  Completed  . Pneumococcal Vaccine 45-49 Years old  Aged Out  . HPV VACCINES  Aged Out    Immunization History  Administered Date(s) Administered  . Influenza-Unspecified 03/14/2019, 03/21/2019, 02/12/2020  . PFIZER(Purple Top)SARS-COV-2 Vaccination 06/03/2019, 06/22/2019, 03/28/2020  . Tdap 12/12/2018    There are no problems to display for this patient.   Past Medical History:  Diagnosis Date  . Abnormal Pap smear of cervix   . Anemia   . BRCA negative 07/2017   MyRisk neg except CDK4 VUS  . Chicken pox   . Family history of breast cancer   . Fibroids   . Increased risk of breast cancer 07/2017   IBIS=23.2%  . Menorrhagia     Past Surgical History:  Procedure Laterality Date  . CERVICAL BIOPSY  W/ LOOP ELECTRODE EXCISION    . GANGLION CYST EXCISION Right 06/07/2018   Procedure: REMOVAL GANGLION OF WRIST;  Surgeon: Hessie Knows, MD;  Location: [facility] ORS;  Service: Orthopedics;  Laterality: Right;  . TUBAL LIGATION  2009    Family History  Problem Relation Age of Onset  . Alcohol abuse Mother   . Cancer Mother        breast x2  . Alcohol abuse Father   . Alcohol abuse Maternal Grandmother   . Cancer Maternal Grandmother        lung    Past Medical History, Surgical History, Social History, Family History, Problem List, Medications, and Allergies have been reviewed and updated if relevant.  Review of Systems: Pertinent positives are listed above.  Otherwise, a full 14 point review of systems has been done in full and it is negative except where it is noted positive.  Objective:   BP 100/62   Pulse 79   Temp 98.6 F (37 C) (Temporal)   Ht 5' 4.5" (1.638 m)   Wt 208 lb 8 oz (94.6 kg)  LMP 10/23/2020   SpO2 95%   BMI 35.24 kg/m  Ideal Body Weight: Weight in (lb) to have BMI = 25: 147.6 No exam data present Depression screen [facility] 2/9 11/16/2020 07/16/2014  Decreased Interest 0 0  Down, Depressed, Hopeless 0 0  PHQ - 2 Score 0 0     GEN: well developed, well nourished, no acute distress Eyes: conjunctiva and lids normal, PERRLA, EOMI ENT: TM clear, nares clear, oral exam WNL Neck: supple, no lymphadenopathy, no thyromegaly, no JVD Pulm: clear to auscultation and percussion, respiratory effort normal CV: regular rate and rhythm, S1-S2, no murmur, rub or  gallop, no bruits Chest: no scars, masses, no lumps BREAST: breast exam declined GI: soft, non-tender; no hepatosplenomegaly, masses; active bowel sounds all quadrants GU: GU exam declined Lymph: no cervical, axillary or inguinal adenopathy MSK: gait normal, muscle tone and strength WNL, no joint swelling, effusions, discoloration, crepitus  SKIN: clear, good turgor, color WNL, no rashes, lesions, or ulcerations Neuro: normal mental status, normal strength, sensation, and motion Psych: alert; oriented to person, place and time, normally interactive and not anxious or depressed in appearance.   All labs reviewed with patient. Results for orders placed or performed in visit on 07/02/20  Cytology - PAP  Result Value Ref Range   High risk HPV Negative    Adequacy      Satisfactory for evaluation; transformation zone component PRESENT.   Diagnosis      - Negative for Intraepithelial Lesions or Malignancy (NILM)   Diagnosis - Benign reactive/reparative changes    Comment Normal Reference Range HPV - Negative    No results found.  Assessment and Plan:     ICD-10-CM   1. Healthcare maintenance  Z00.00   2. Screening, lipid  Z13.220 Lipid panel  3. Screening for diabetes mellitus  P71.0 Basic metabolic panel    Hemoglobin A1c  4. Other fatigue  R53.83 CBC with Differential/Platelet    Hepatic function panel    TSH   , She is healthy at age 74.  She has a BMI 35, and I think the main thing that she needs to do is work on eating well, getting plenty of exercise, and trying to lose weight.  She has also cut back on her drinking recently, and I think that this is great.  Health Maintenance Exam: The patient's preventative maintenance and recommended screening tests for an annual wellness exam were reviewed in full today. Brought up to date unless services declined.  Counselled on the importance of diet, exercise, and its role in overall health and mortality. The patient's FH and SH  was reviewed, including their home life, tobacco status, and drug and alcohol status.  Follow-up in 1 year for physical exam or additional follow-up below.  Follow-up: No follow-ups on file. Or follow-up in 1 year if not noted.  No future appointments.  No orders of the defined types were placed in this encounter.  Medications Discontinued During This Encounter  Medication Reason  . acetaminophen (TYLENOL) 325 MG tablet Completed Course  . chlorhexidine (PERIDEX) 0.12 % solution Completed Course  . ibuprofen (ADVIL) 600 MG tablet Completed Course  . norgestimate-ethinyl estradiol (ORTHO-CYCLEN) 0.25-35 MG-MCG tablet Completed Course  . norgestimate-ethinyl estradiol (ORTHO-CYCLEN) 0.25-35 MG-MCG tablet Completed Course  . oxyCODONE (OXY IR/ROXICODONE) 5 MG immediate release tablet Completed Course   Orders Placed This Encounter  Procedures  . Basic metabolic panel  . CBC with Differential/Platelet  . Hepatic function panel  . Hemoglobin A1c  .  Lipid panel  . TSH    Signed,  Frederico Hamman T. Jamarius Saha, MD   Allergies as of 11/16/2020   No Known Allergies     Medication List       Accurate as of November 16, 2020  3:53 PM. If you have any questions, ask your nurse or doctor.        STOP taking these medications   chlorhexidine 0.12 % solution Commonly known as: PERIDEX Stopped by: Owens Loffler, MD   ibuprofen 600 MG tablet Commonly known as: ADVIL Stopped by: Owens Loffler, MD   Mono-Linyah 0.25-35 MG-MCG tablet Generic drug: norgestimate-ethinyl estradiol Stopped by: Owens Loffler, MD   Non-Aspirin Pain Reliever 325 MG tablet Generic drug: acetaminophen Stopped by: Owens Loffler, MD   norgestimate-ethinyl estradiol 0.25-35 MG-MCG tablet Commonly known as: ORTHO-CYCLEN Stopped by: Owens Loffler, MD   oxyCODONE 5 MG immediate release tablet Commonly known as: Oxy IR/ROXICODONE Stopped by: Owens Loffler, MD

## 2020-11-17 LAB — LIPID PANEL
Cholesterol: 172 mg/dL (ref 0–200)
HDL: 70.6 mg/dL (ref >39.00)
LDL Cholesterol: 91 mg/dL (ref 0–99)
NonHDL: 100.99
Total CHOL/HDL Ratio: 2
Triglycerides: 50 mg/dL (ref 0.0–149.0)
VLDL: 10 mg/dL (ref 0.0–40.0)

## 2020-11-17 LAB — HEPATIC FUNCTION PANEL
ALT: 10 U/L (ref 0–35)
AST: 17 U/L (ref 0–37)
Albumin: 4.3 g/dL (ref 3.5–5.2)
Alkaline Phosphatase: 69 U/L (ref 39–117)
Bilirubin, Direct: 0.1 mg/dL (ref 0.0–0.3)
Total Bilirubin: 0.5 mg/dL (ref 0.2–1.2)
Total Protein: 7.1 g/dL (ref 6.0–8.3)

## 2020-11-17 LAB — CBC WITH DIFFERENTIAL/PLATELET
Basophils Absolute: 0.1 10*3/uL (ref 0.0–0.1)
Basophils Relative: 0.9 % (ref 0.0–3.0)
Eosinophils Absolute: 0.1 10*3/uL (ref 0.0–0.7)
Eosinophils Relative: 1.9 % (ref 0.0–5.0)
HCT: 36.9 % (ref 36.0–46.0)
Hemoglobin: 12.1 g/dL (ref 12.0–15.0)
Lymphocytes Relative: 46.9 % — ABNORMAL HIGH (ref 12.0–46.0)
Lymphs Abs: 3.2 10*3/uL (ref 0.7–4.0)
MCHC: 32.9 g/dL (ref 30.0–36.0)
MCV: 73 fl — ABNORMAL LOW (ref 78.0–100.0)
Monocytes Absolute: 0.5 10*3/uL (ref 0.1–1.0)
Monocytes Relative: 6.9 % (ref 3.0–12.0)
Neutro Abs: 3 10*3/uL (ref 1.4–7.7)
Neutrophils Relative %: 43.4 % (ref 43.0–77.0)
Platelets: 326 10*3/uL (ref 150.0–400.0)
RBC: 5.06 Mil/uL (ref 3.87–5.11)
RDW: 14.8 % (ref 11.5–15.5)
WBC: 6.8 10*3/uL (ref 4.0–10.5)

## 2020-11-17 LAB — BASIC METABOLIC PANEL
BUN: 14 mg/dL (ref 6–23)
CO2: 25 mEq/L (ref 19–32)
Calcium: 9.6 mg/dL (ref 8.4–10.5)
Chloride: 103 mEq/L (ref 96–112)
Creatinine, Ser: 0.72 mg/dL (ref 0.40–1.20)
GFR: 107.94 mL/min (ref >60.00)
Glucose, Bld: 82 mg/dL (ref 70–99)
Potassium: 4.1 mEq/L (ref 3.5–5.1)
Sodium: 138 mEq/L (ref 135–145)

## 2020-11-17 LAB — TSH: TSH: 0.86 u[IU]/mL (ref 0.35–4.50)

## 2020-11-17 LAB — HEMOGLOBIN A1C: Hgb A1c MFr Bld: 6 % (ref 4.6–6.5)

## 2020-11-18 DIAGNOSIS — M9901 Segmental and somatic dysfunction of cervical region: Secondary | ICD-10-CM | POA: Diagnosis not present

## 2020-11-18 DIAGNOSIS — M9905 Segmental and somatic dysfunction of pelvic region: Secondary | ICD-10-CM | POA: Diagnosis not present

## 2020-11-18 DIAGNOSIS — M5417 Radiculopathy, lumbosacral region: Secondary | ICD-10-CM | POA: Diagnosis not present

## 2020-11-18 DIAGNOSIS — R519 Headache, unspecified: Secondary | ICD-10-CM | POA: Diagnosis not present

## 2020-11-18 DIAGNOSIS — M62830 Muscle spasm of back: Secondary | ICD-10-CM | POA: Diagnosis not present

## 2020-11-18 DIAGNOSIS — M9903 Segmental and somatic dysfunction of lumbar region: Secondary | ICD-10-CM | POA: Diagnosis not present

## 2020-11-18 DIAGNOSIS — M531 Cervicobrachial syndrome: Secondary | ICD-10-CM | POA: Diagnosis not present

## 2020-11-18 DIAGNOSIS — M9902 Segmental and somatic dysfunction of thoracic region: Secondary | ICD-10-CM | POA: Diagnosis not present

## 2020-11-20 DIAGNOSIS — M9905 Segmental and somatic dysfunction of pelvic region: Secondary | ICD-10-CM | POA: Diagnosis not present

## 2020-11-20 DIAGNOSIS — M531 Cervicobrachial syndrome: Secondary | ICD-10-CM | POA: Diagnosis not present

## 2020-11-20 DIAGNOSIS — M62830 Muscle spasm of back: Secondary | ICD-10-CM | POA: Diagnosis not present

## 2020-11-20 DIAGNOSIS — R519 Headache, unspecified: Secondary | ICD-10-CM | POA: Diagnosis not present

## 2020-11-20 DIAGNOSIS — M9902 Segmental and somatic dysfunction of thoracic region: Secondary | ICD-10-CM | POA: Diagnosis not present

## 2020-11-20 DIAGNOSIS — M5417 Radiculopathy, lumbosacral region: Secondary | ICD-10-CM | POA: Diagnosis not present

## 2020-11-20 DIAGNOSIS — M9901 Segmental and somatic dysfunction of cervical region: Secondary | ICD-10-CM | POA: Diagnosis not present

## 2020-11-20 DIAGNOSIS — M9903 Segmental and somatic dysfunction of lumbar region: Secondary | ICD-10-CM | POA: Diagnosis not present

## 2020-11-26 DIAGNOSIS — M9901 Segmental and somatic dysfunction of cervical region: Secondary | ICD-10-CM | POA: Diagnosis not present

## 2020-11-26 DIAGNOSIS — M5417 Radiculopathy, lumbosacral region: Secondary | ICD-10-CM | POA: Diagnosis not present

## 2020-11-26 DIAGNOSIS — M9905 Segmental and somatic dysfunction of pelvic region: Secondary | ICD-10-CM | POA: Diagnosis not present

## 2020-11-26 DIAGNOSIS — M9903 Segmental and somatic dysfunction of lumbar region: Secondary | ICD-10-CM | POA: Diagnosis not present

## 2020-11-26 DIAGNOSIS — R519 Headache, unspecified: Secondary | ICD-10-CM | POA: Diagnosis not present

## 2020-11-26 DIAGNOSIS — M9902 Segmental and somatic dysfunction of thoracic region: Secondary | ICD-10-CM | POA: Diagnosis not present

## 2020-11-26 DIAGNOSIS — M62830 Muscle spasm of back: Secondary | ICD-10-CM | POA: Diagnosis not present

## 2020-11-26 DIAGNOSIS — M531 Cervicobrachial syndrome: Secondary | ICD-10-CM | POA: Diagnosis not present

## 2020-11-27 IMAGING — CR CHEST - 2 VIEW
2 series · 2 of 2 positions shown · non-contrast
Comparison: None.

CLINICAL DATA: Positive QuantiFERON test for tuberculosis

EXAM:
CHEST - 2 VIEW

[chest pa]
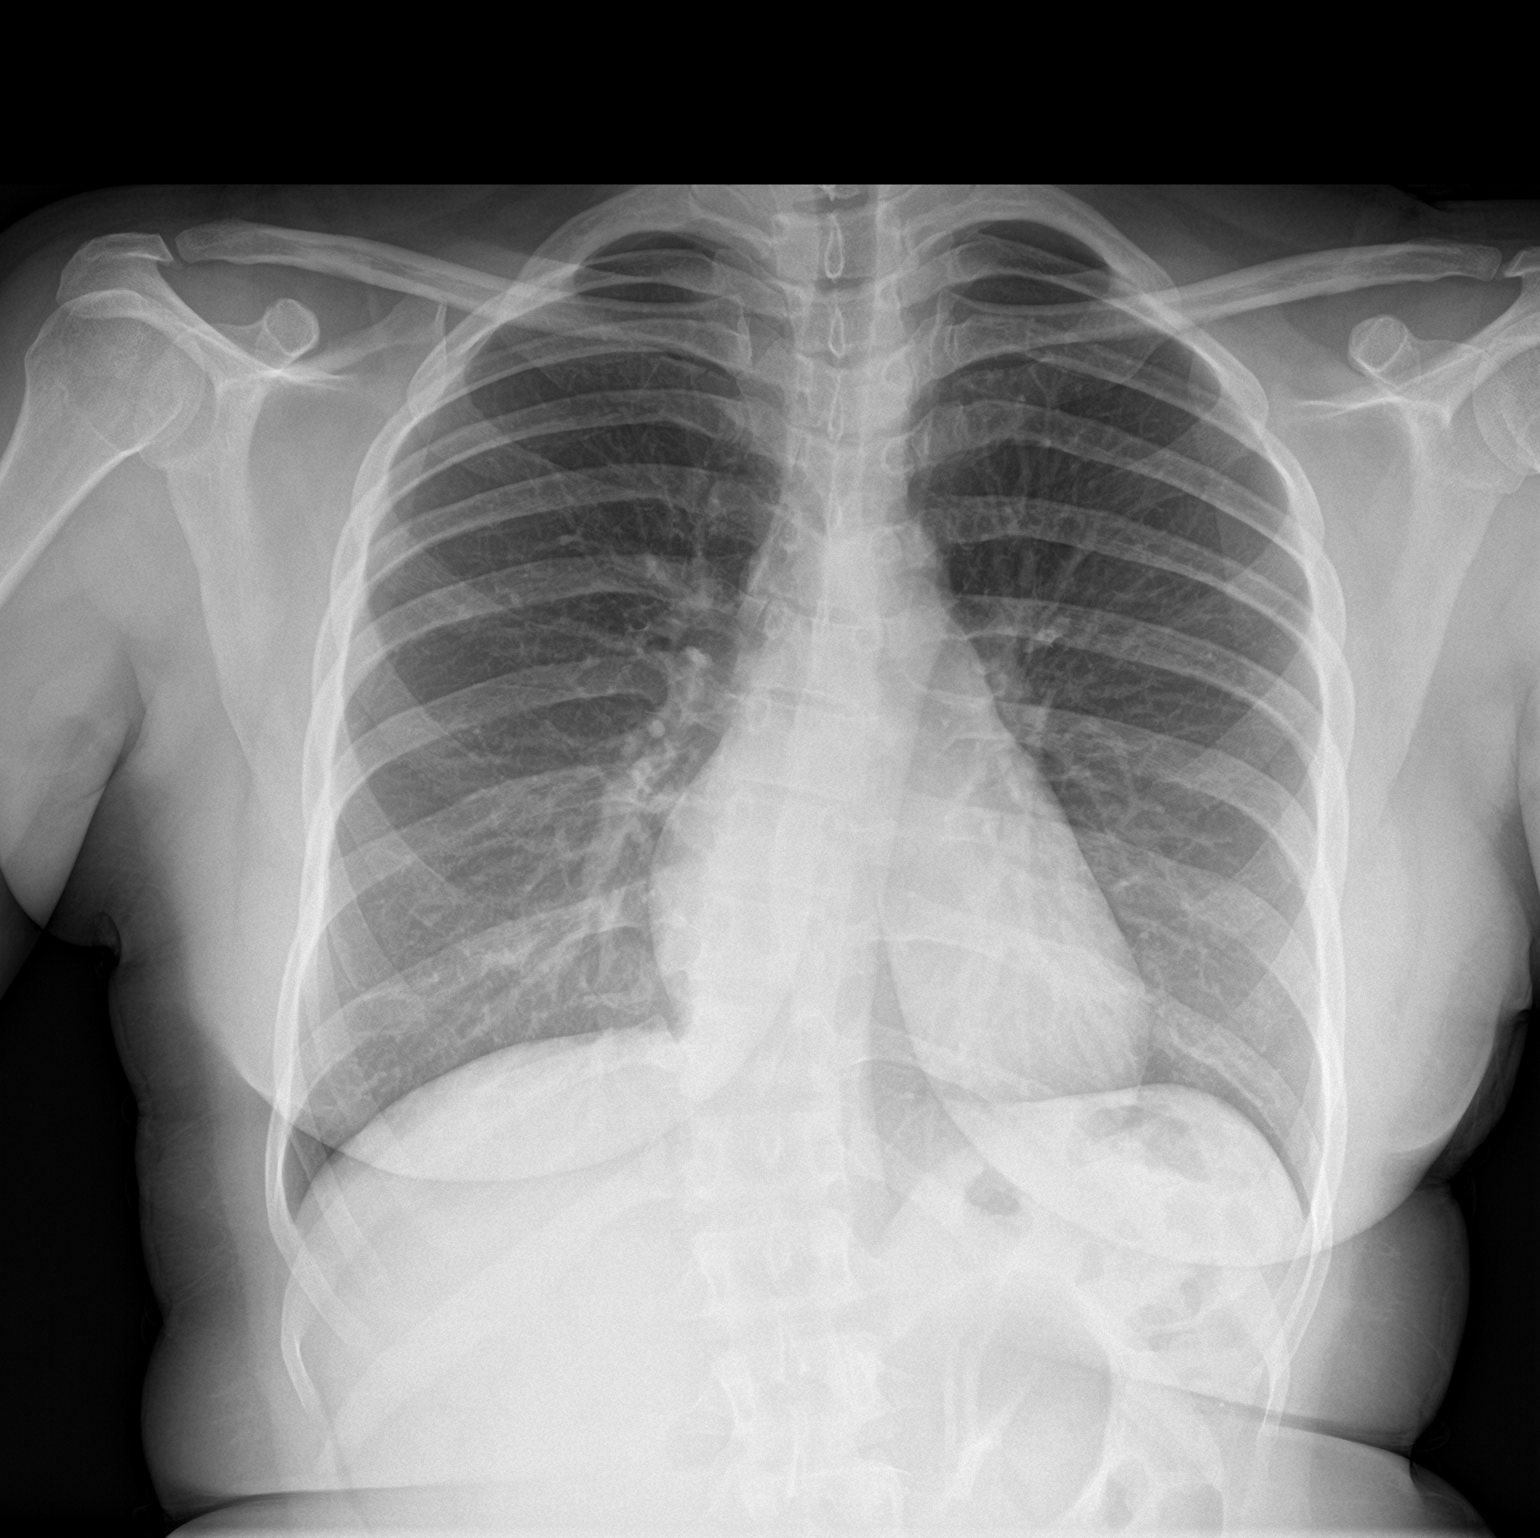

[chest lat]
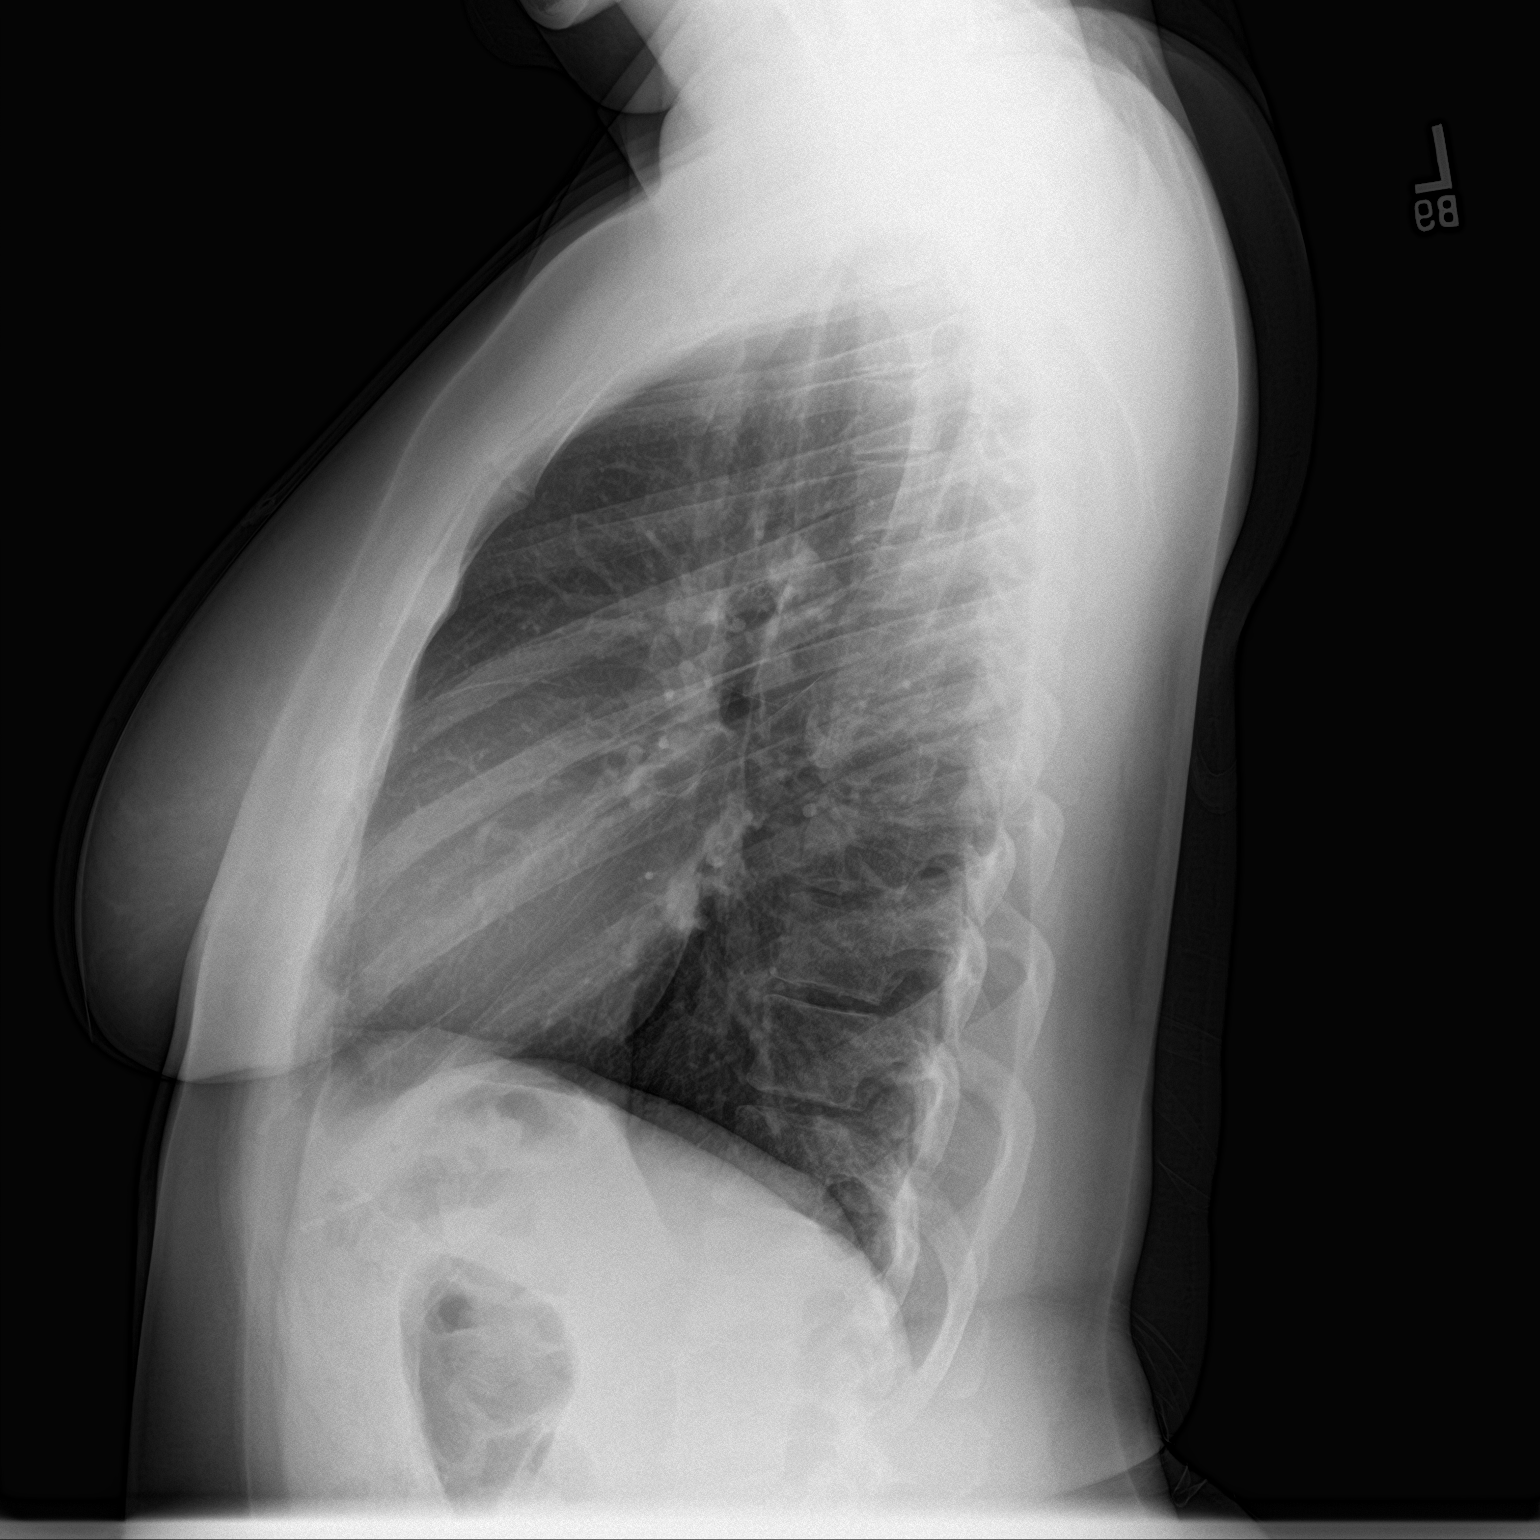

[2 of 2 positions shown; findings below may reference images not displayed]

FINDINGS: Lungs are clear. Heart size and pulmonary vascularity are normal. No
adenopathy. There is lower thoracic dextroscoliosis.
IMPRESSION: No edema or consolidation.  No evident adenopathy.

## 2020-12-01 DIAGNOSIS — M9903 Segmental and somatic dysfunction of lumbar region: Secondary | ICD-10-CM | POA: Diagnosis not present

## 2020-12-01 DIAGNOSIS — M531 Cervicobrachial syndrome: Secondary | ICD-10-CM | POA: Diagnosis not present

## 2020-12-01 DIAGNOSIS — R519 Headache, unspecified: Secondary | ICD-10-CM | POA: Diagnosis not present

## 2020-12-01 DIAGNOSIS — M9905 Segmental and somatic dysfunction of pelvic region: Secondary | ICD-10-CM | POA: Diagnosis not present

## 2020-12-01 DIAGNOSIS — M5417 Radiculopathy, lumbosacral region: Secondary | ICD-10-CM | POA: Diagnosis not present

## 2020-12-01 DIAGNOSIS — M9901 Segmental and somatic dysfunction of cervical region: Secondary | ICD-10-CM | POA: Diagnosis not present

## 2020-12-01 DIAGNOSIS — M62830 Muscle spasm of back: Secondary | ICD-10-CM | POA: Diagnosis not present

## 2020-12-01 DIAGNOSIS — M9902 Segmental and somatic dysfunction of thoracic region: Secondary | ICD-10-CM | POA: Diagnosis not present

## 2020-12-26 DIAGNOSIS — Z20822 Contact with and (suspected) exposure to covid-19: Secondary | ICD-10-CM | POA: Diagnosis not present

## 2021-01-02 ENCOUNTER — Other Ambulatory Visit (HOSPITAL_COMMUNITY): Payer: Self-pay

## 2021-01-02 MED ORDER — CARESTART COVID-19 HOME TEST VI KIT
PACK | 0 refills | Status: AC
  Filled 2021-01-02: qty 2, 4d supply, fill #0
